# Patient Record
Sex: Male | Born: 1950 | Race: White | Hispanic: No | Marital: Married | Smoking: Former smoker
Health system: Southern US, Community
[De-identification: ages and names within clinical notes are randomized; demographics above are authoritative.]

## PROBLEM LIST (undated history)

## (undated) DIAGNOSIS — Z87442 Personal history of urinary calculi: Secondary | ICD-10-CM

## (undated) DIAGNOSIS — E785 Hyperlipidemia, unspecified: Secondary | ICD-10-CM

## (undated) DIAGNOSIS — M1990 Unspecified osteoarthritis, unspecified site: Secondary | ICD-10-CM

## (undated) DIAGNOSIS — I10 Essential (primary) hypertension: Secondary | ICD-10-CM

## (undated) DIAGNOSIS — R55 Syncope and collapse: Secondary | ICD-10-CM

## (undated) DIAGNOSIS — I2510 Atherosclerotic heart disease of native coronary artery without angina pectoris: Secondary | ICD-10-CM

## (undated) DIAGNOSIS — R001 Bradycardia, unspecified: Secondary | ICD-10-CM

## (undated) HISTORY — PX: SKIN CANCER EXCISION: SHX779

## (undated) HISTORY — PX: TOTAL HIP ARTHROPLASTY: SHX124

## (undated) HISTORY — DX: Bradycardia, unspecified: R00.1

## (undated) HISTORY — DX: Syncope and collapse: R55

## (undated) HISTORY — DX: Essential (primary) hypertension: I10

## (undated) HISTORY — DX: Hyperlipidemia, unspecified: E78.5

## (undated) HISTORY — PX: ROTATOR CUFF REPAIR: SHX139

---

## 2005-03-14 ENCOUNTER — Inpatient Hospital Stay (HOSPITAL_COMMUNITY): Admission: RE | Admit: 2005-03-14 | Discharge: 2005-03-18 | Payer: Self-pay | Admitting: Orthopedic Surgery

## 2005-07-10 DIAGNOSIS — C4490 Unspecified malignant neoplasm of skin, unspecified: Secondary | ICD-10-CM | POA: Insufficient documentation

## 2005-07-10 HISTORY — DX: Unspecified malignant neoplasm of skin, unspecified: C44.90

## 2006-01-15 ENCOUNTER — Ambulatory Visit: Payer: Self-pay | Admitting: Oncology

## 2006-04-09 ENCOUNTER — Ambulatory Visit: Payer: Self-pay | Admitting: Oncology

## 2006-07-12 IMAGING — CR DG CHEST 2V
2 series · 2 of 2 positions shown · non-contrast
Comparison: none

CLINICAL DATA: Osteoarthritis of right hip.  Some chest congestion.  Nonsmoker.  Hypertension.  Patient for right total hip replacement.
 CHEST - 2 VIEW:

[view not recorded (1 of 2)]
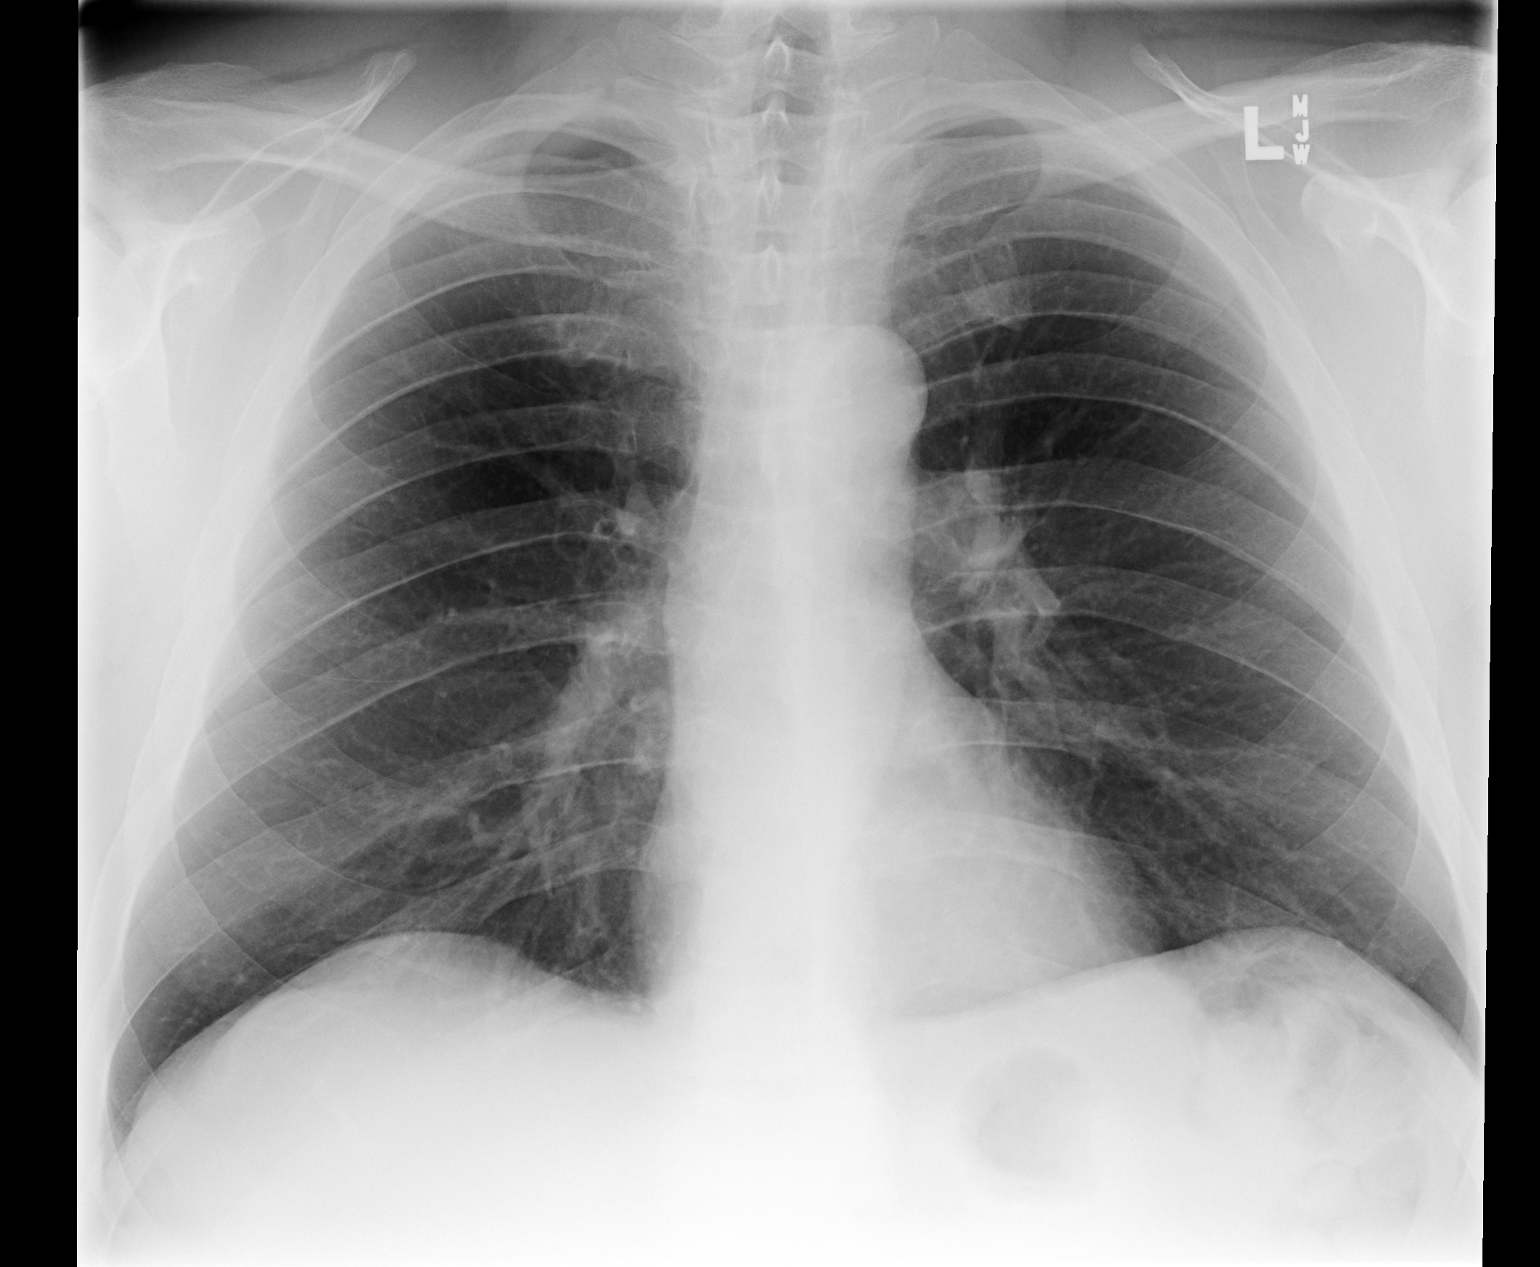

[view not recorded (2 of 2)]
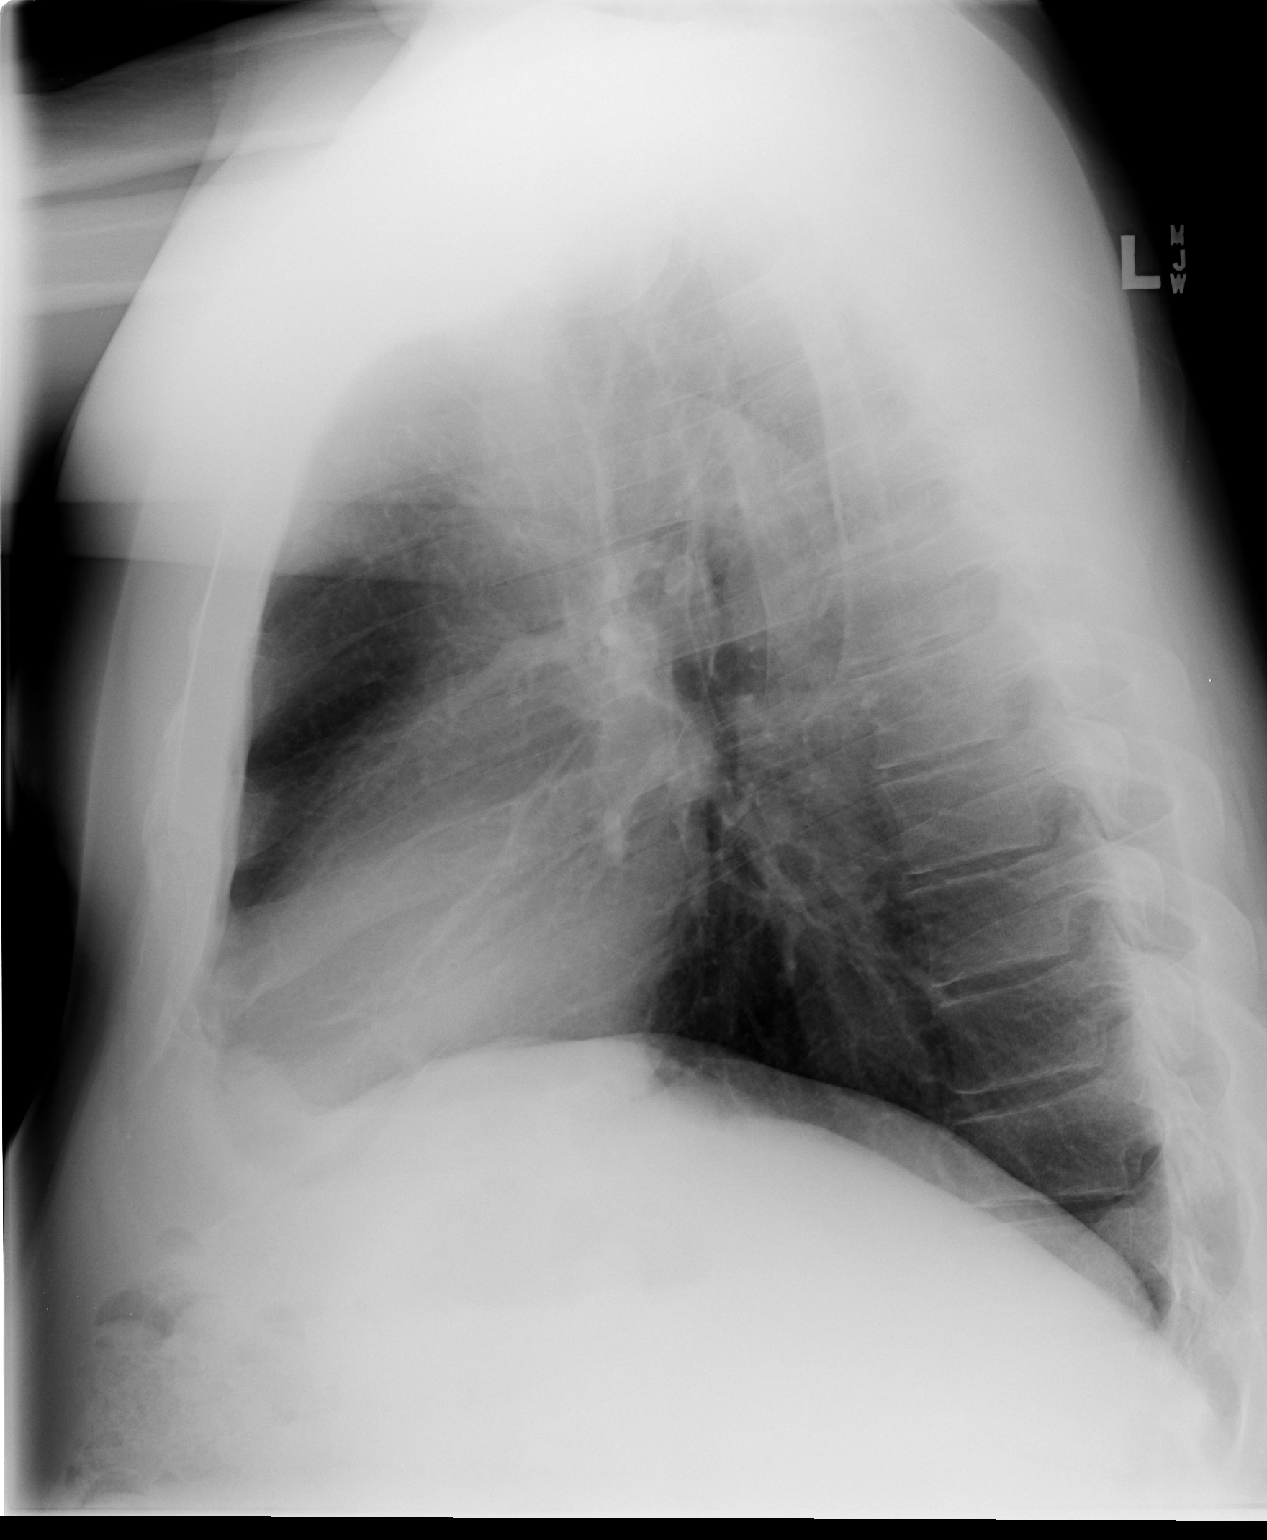

[2 of 2 positions shown; findings below may reference images not displayed]

FINDINGS: PA and lateral views of the chest without previous films for comparison show the aorta to be minimally elongated but not calcified or dilated.  The heart is normal and the lungs are clear.  The bony thorax appears normal.
IMPRESSION: No evidence of active disease in the chest.

## 2006-07-30 ENCOUNTER — Ambulatory Visit: Payer: Self-pay | Admitting: Oncology

## 2006-10-22 ENCOUNTER — Ambulatory Visit: Payer: Self-pay | Admitting: Oncology

## 2007-02-11 ENCOUNTER — Ambulatory Visit: Payer: Self-pay | Admitting: Oncology

## 2010-11-25 NOTE — H&P (Signed)
Steven Kennedy, Steven Kennedy                ACCOUNT NO.:  [id number]   MEDICAL RECORD NO.:  [id number]          PATIENT TYPE:  INP   LOCATION:                               FACILITY:  [facility]   PHYSICIAN:  Rodney A. Mortenson, M.D.DATE OF BIRTH:  [birth year]   DATE OF ADMISSION:  03/14/2005  DATE OF DISCHARGE:                                HISTORY & PHYSICAL   CHIEF COMPLAINT:  Right hip pain.   HISTORY OF PRESENT ILLNESS:  Patient is a 60 year old white male with  about one-year history of progressively worsening right hip pain and  stiffness.  Patient states initially he started with some discomfort and  stiffness in the hip with awkward movements.  It has progressively worsened.  He now has significant amount of stiffness and deep, dull achiness in the  groin.  He does have mechanical symptoms.  He has minimal night pain.  Pain  worsens with the amount of activity and the amount of time standing on his  feet.  X-rays reveal bone on bone, femoral head in the acetabulum with a  cystic acetabulum.   ALLERGIES:  No known drug allergies.   CURRENT MEDICATIONS:  1.  Flaxseed oil 1000 mg p.o. b.i.d.  2.  Fish oil 1000 mg p.o. b.i.d.  3.  Cod liver oil with vitamin A and D one tablet p.o. b.i.d.  4.  Garlic 500 mg p.o. b.i.d.  5.  Multivitamins with mineral one tablet p.o. daily.   PAST MEDICAL HISTORY:  Patient denies any significant medical history  including diabetes, hypertension, heart disease, asthma.   PAST SURGICAL HISTORY:  1.  Right shoulder rotator cuff arthroscopy.  2.  Vasectomy.  3.  Patient denies any complications of the above-mentioned anesthetic      procedures.   SOCIAL HISTORY:  Patient is a 60 year old healthy-appearing white male.  Denies any history of smoking or alcohol use.  Is married.  Has two grown  children.  Lives in a [place] house.  Is currently employed and working as  a Merchandiser, retail with [employer].   Family physician is Dr. Beather Arbour in  [place].   FAMILY HISTORY:  Mother is alive at 49 years of age with a history of a  CABG.  Father is deceased at 9 years of age from heart failure.  One  brother is deceased from a suicide.  Two sisters alive in good medical  health.   REVIEW OF SYSTEMS:  Positive for pollen-related allergies.  He does wear  glasses occasionally for near sightedness and reading.  Otherwise, all  review of systems are negative in general, sensory, respiratory, cardiac,  GI, GU, hematological, musculoskeletal issues if not mentioned above.   PHYSICAL EXAMINATION:  VITAL SIGNS:  Height is 6 feet 1 inch, weight is 225  pounds, blood pressure is 144/88, pulse is 60 and regular, respirations 12.  Patient is afebrile.  GENERAL:  This is a healthy-appearing, well-developed white male.  Ambulates  with an antalgic gait on the right.  Easily gets on and off the examination  table.  HEENT:  Head was normocephalic.  Pupils are equal, round, and reactive.  Extraocular movements intact.  Sclerae not icteric.  Conjunctivae pink and  moist.  The external ears are without deformities.  Oral buccal mucosa is  pink and moist.  Gross hearing is intact.  NECK:  Supple.  No palpable lymphadenopathy.  Thyroid region was nontender.  Patient had good range of motion of cervical spine without any difficulty or  tenderness.  EXTREMITIES:  Upper extremities were symmetrically size and shape.  He had  full range of motion of shoulders, elbows, and wrists.  Motor strength was  5/5.  He did have some deformities and loss of range of motion of his  fingers related to previous injuries.  Lower extremities:  Right hip had  full extension, flexion up to 90 degrees with about 5 degrees of internal  rotation and 10 degrees external rotation limited by mechanical and  discomfort in the right groin.  Left hip had full extension/flexion up to  130 degrees with 20 degrees internal/external rotation without any  mechanical symptoms of  discomfort.  Bilateral knees were symmetrical with no  effusions, no erythema.  Without full extension, flexion back to 120  degrees.  No instability.  The calves were nontender.  The ankles were  symmetrical with good dorsi/plantar flexion.  HEART:  Regular rate and rhythm.  No murmurs, rubs, or gallops.  LUNGS:  Clear and equal bilaterally.  No wheezes, rales, rhonchi.  PERIPHERAL VASCULAR:  Carotid pulses were 2+.  No bruits.  Radial pulses  were 2+.  Dorsalis pedis was 1+.  He had no lower extremity edema or venous  stasis changes.  He did have a few varicosities in the lower extremities.  NEUROLOGIC:  Patient was conscious, alert, and appropriate.  Ease in  conversation with examiner.  Cranial nerves II-XII were grossly intact.  He  had no gross neurologic defects noted.  BREASTS:  Deferred at this time.  RECTAL:  Deferred at this time.  GENITOURINARY:  Deferred at this time.   IMPRESSION:  End-stage osteoarthritis right hip.   PLAN:  Patient will undergo all routine laboratories and tests prior to  having a right total hip arthroplasty by Dr. Chaney Malling at [facility] on March 14, 2005.      Jamelle Rushing, P.A.    ______________________________  Lenard Galloway Chaney Malling, M.D.    RWK/MEDQ  D:  03/06/2005  T:  03/06/2005  Job:  [id number]

## 2010-11-25 NOTE — Op Note (Signed)
Steven Kennedy, KISTLER NO.:  [id number]   MEDICAL RECORD NO.:  [id number]          PATIENT TYPE:  INP   LOCATION:  2899                         FACILITY:  [facility]   PHYSICIAN:  Lenard Galloway. Mortenson, M.D.DATE OF BIRTH:  06-18-1951   DATE OF PROCEDURE:  03/14/2005  DATE OF DISCHARGE:                                 OPERATIVE REPORT   PREOPERATIVE DIAGNOSIS:  Severe osteoarthritis, right hip.   POSTOPERATIVE DIAGNOSIS:  Severe osteoarthritis, right hip.   OPERATION PERFORMED:  Total hip replacement on the right using a AML small  stature 13.5 mm diameter with a 100 series Pinnacle acetabular cup outside  diameter 58 mm, Apex hole eliminator and a +4 10 degree liner with a 36 mm  inside diameter and a +5 36 mm metal femoral head.   SURGEON:  Lenard Galloway. Chaney Malling, M.D.   ASSISTANT:  1.  Claude Manges. Cleophas Dunker, M.D.  2.  Legrand Pitts. Duffy, P.A.   ANESTHESIA:  General.   DESCRIPTION OF PROCEDURE:  Patient placed on the operating table in supine  position.  After satisfactory general anesthesia, the patient was placed in  full lateral position with the right hip up.  The right hip was prepped with  DuraPrep and draped out in the usual manner.  A Vi-drape was placed over the  operative site.  A posterior Southern incision was made.  Skin edges were  retracted.  Self-retaining retractor was put in the wound.  Bleeders were  coagulated.  Tensor fascia lata was then split. The external hip rotators  were then identified and isolated.  The piriformis was tagged.  The sciatic  nerve was palpated with the finger and this is out of the operative field.  The piriformis was then released and reflected to the midline.  This gave  excellent exposure to the capsule with the hip internally rotated.  Incision  made through the capsule from the superior neck to the inferior neck.  Hip  was then dislocated.  With the head and neck exposed, the neck was amputated  appropriate length using the  external guide and saw.  This was then removed.  Excellent access to the acetabulum was achieved.  Bleeders were coagulated.  Attention was then turned to the proximal femur. A needle was placed  underneath the femoral neck and a hole starter was placed in the area of the  piriformis fossa.  Once this was accomplished, the canal finder was passed  down the femoral canal. A series of fully fluted reamers was then used and  this was reamed up to 13 mm in diameter.  There was excellent capture at  this level.  A series of broaches was then used and this broached out to a  13.5 mm small stature AML.  This seated very nicely in the calcar.  Calcar  reamer was then used.  The broach fit very tightly in the canal.  The broach  was then removed.  Attention was then turned to the acetabulum.  Sharp Cobra  retractor was placed around the rim.  Labrum was excised.  A series of  cheese cut reamers then used ream out the acetabulum.  This was reamed out  to a 57 mm outside diameter and good bleeding bone was achieved.  A 56 mm  trial was inserted and this bottomed out nicely.  A 58 mm trial was inserted  and this hung up on the rim very nicely.  This was felt to be the  appropriate size.  The final 58 mm acetabular Pinnacle cup was then driven  down into the acetabulum.  There was an excellent press fit.  This was an  anatomic fit.  A trial liner was then inserted.  The broach was passed down  the femoral canal again and a series of neck lengths were tried.  The +5  neck length with a 36 mm femoral ball was inserted and there was excellent  range of motion.  Leg lengths were essentially equal.  There was good  stability with internal and external rotation in traction.  The hip was  dislocated.  All the trial components were then removed.  Throughout the  procedure, the hip was irrigated with copious amounts of saline solution.  Retractor used to protect the sciatic nerve throughout the procedure.  The   final poly was then inserted, snap fit in place after the apex hole  eliminator had been inserted.  The final femoral AML prosthesis was then  driven down the femoral canal and had excellent press fit.  The trial ball  was inserted once again and the hip was relocated and put through a full  range of motion and it was felt this was appropriate length ball fit. Hip  was dislocated and a final +5 36 mm outer diameter ball was then placed over  the trunnion and then impacted.  An excellent fit was then achieved. The hip  was then relocated, and put through a full range of motion.  The capsule was  then closed almost anatomically using heavy FiberWire sutures.  Piriformis  was then reattached.  Tensor fascia lata was then closed with interrupted  Ethibond suture.  Vicryl was used to close the subcutaneous tissue and  stainless steel staples used to close the skin.  Sterile dressings were  applied and the patient returned to the recovery room in excellent  condition. Technically, this procedure went extremely well.   DRAINS:  None.   COMPLICATIONS:  None.   I was very pleased with the surgical outcome.           ______________________________  Lenard Galloway. Chaney Malling, M.D.     RAM/MEDQ  D:  03/14/2005  T:  03/14/2005  Job:  [id number]

## 2010-11-25 NOTE — Discharge Summary (Signed)
Steven Kennedy, Steven Kennedy                ACCOUNT NO.:  [id number]   MEDICAL RECORD NO.:  [id number]          PATIENT TYPE:  INP   LOCATION:  5006                         FACILITY:  [facility]   PHYSICIAN:  Rodney A. Mortenson, M.D.DATE OF BIRTH:  01-14-1951   DATE OF ADMISSION:  03/14/2005  DATE OF DISCHARGE:  03/18/2005                                 DISCHARGE SUMMARY   ADMISSION DIAGNOSES:  End-stage osteoarthritis right hip.   DISCHARGE DIAGNOSES:  1.  End-stage osteoarthritis right hip.  2.  Acute blood loss anemia secondary to surgery.  3.  Nausea second to pain medications, now resolved.  4.  Constipation.  5.  Hiccups x3 days with irregular EKG with negative work-up.   SURGICAL PROCEDURE:  On March 14, 2005 Steven Kennedy underwent a right total  hip arthroplasty by Dr. Rinaldo Ratel, assisted by Dr. Claude Manges. Steven Kennedy  and [employer], PA-C.  He had a Pinnacle 100 series acetabular cup size 58  mm with Pinnacle Marathon acetabular liner +4, 10-degree  58 mm outer  diameter, 36 mm inner diameter, and an apex hole eliminator.  An AML small  stature 155 mm length, 43 mm offset size, 13.5 femoral stem with an Articul-  eze metal on metal femoral head 36 mm +5 neck length, 12/14 cone.   COMPLICATIONS:  None.   CONSULTANT:  1.  Physical therapy and occupational therapy consult September 6 and      March 16, 2005  2.  Cardiology consult by Dr. Sharyn Lull March 17, 2005   HISTORY OF PRESENT ILLNESS:  This 60 year old white male patient presented  to Dr. Chaney Malling with a one year history of progressively worsening right  hip pain and stiffness.  Is now significant stiffness and deep dull achiness  in the groin.  He complains of minimal night pain.  Pain increases with  activity and standing on his feet.  X-rays show end-stage arthritic changes.  He has failed conservative treatment and because of this he is presenting  for a right hip replacement.   HOSPITAL COURSE:  Steven Kennedy  tolerated his surgical procedure well without  immediate postoperative complications.  He was transferred to 5000.  On  postoperative day one Tmax 98, vitals stable.  Leg was neurovascular intact.  He was complaining of some nausea with medications and those were adjusted.  He was started on therapy per protocol.   Postoperative day two Tmax 101.1, hemoglobin 11.6, hematocrit 32.5.  Right  hip incision was benign.  He was switched to p.o. pain medications,  continued on therapy.   On postoperative day three he complained of continued hiccups for the last  several days.  Vitals were stable.  Hemoglobin had stabilized at 10.5,  hematocrit 29.4.  Wound was benign.  Because of his continued hiccups an EKG  and cardiac enzymes were obtained.  EKG did show possible inferior infarct  age undetermined.  A cardiology consult by Dr. Sharyn Lull was obtained.  He  evaluated and his cardiac enzymes were negative.  It was felt this was not  cardiac in origin and it was felt he  was okay for discharge home at that  time.  He did recommend a cardiac work-up for a Persantine test and 2-D  echocardiogram as an outpatient after discharge.  Patient was discharged  home later that day.   DISCHARGE INSTRUCTIONS:   DIET:  He can resume his regular pre hospitalization diet.   MEDICATIONS:  He may resume his preoperative medications.  These include:  1.  Flaxseed oil 1000 mg p.o. b.i.d..  2.  Fish oil 1000 mg p.o. b.i.d..  3.  Cod liver oil with vitamin A and D one tablet p.o. b.i.d..  4.  Garlic 500 mg p.o. b.i.d..  5.  Multivitamin with minerals one tablet p.o. q.a.m.   Additional medications at this time include:  1.  Arixtra 2.5 mg subcutaneous every 8 p.m., last dose March 22, 2005.  2.  Percocet 5/325 mg one to two tablets p.o. q.4h. p.r.n. for pain, 50 with      no refill.  3.  Robaxin 500 mg one to two tablets p.o. q.6h. p.r.n. for spasms, 40 with      no refill.   ACTIVITY:  He can be out of  bed partial weightbearing 50% or less on the  right leg with the use of the walker.  He is to have home health PT per  [facility].   WOUND CARE:  He may shower after no drainage from the wound for two days.  Please see the blue total hip discharge sheet for further activity and wound  care instructions.   FOLLOW-UP:  He needs to follow up with Dr. Chaney Malling in our office in  approximately 10 days and needs to call [phone] for that appointment.   LABORATORY DATA:  X-ray taken of his right hip on March 14, 2005 showed  no complications status post right hip replacement.   Hemoglobin/hematocrit ranged from 15.3 and 44 on September 5 to a low of  10.5 and 29.4 on September 8.  Platelets and white count were within normal  limits.   Glucose ranged from 90 on September 5 to a high of 111 on September 6.  Calcium ranged from 9.2 on the 5th to a low of 8.3 on the 6th and 7th.  All  other laboratory studies were within normal limits.      Legrand Pitts Duffy, P.A.    ______________________________  Lenard Galloway Chaney Malling, M.D.    KED/MEDQ  D:  05/24/2005  T:  05/24/2005  Job:  [id number]   cc:   Eduardo Osier. Sharyn Lull, M.D.  Fax: [phone]   Beather Arbour, M.D.

## 2015-09-01 DIAGNOSIS — J0100 Acute maxillary sinusitis, unspecified: Secondary | ICD-10-CM | POA: Diagnosis not present

## 2016-04-24 DIAGNOSIS — Z6829 Body mass index (BMI) 29.0-29.9, adult: Secondary | ICD-10-CM | POA: Diagnosis not present

## 2016-04-24 DIAGNOSIS — M545 Low back pain: Secondary | ICD-10-CM | POA: Diagnosis not present

## 2016-05-04 DIAGNOSIS — L570 Actinic keratosis: Secondary | ICD-10-CM | POA: Diagnosis not present

## 2016-05-04 DIAGNOSIS — C44519 Basal cell carcinoma of skin of other part of trunk: Secondary | ICD-10-CM | POA: Diagnosis not present

## 2016-05-08 DIAGNOSIS — M545 Low back pain: Secondary | ICD-10-CM | POA: Diagnosis not present

## 2016-05-18 DIAGNOSIS — C44519 Basal cell carcinoma of skin of other part of trunk: Secondary | ICD-10-CM | POA: Diagnosis not present

## 2016-06-02 DIAGNOSIS — J0100 Acute maxillary sinusitis, unspecified: Secondary | ICD-10-CM | POA: Diagnosis not present

## 2016-09-05 DIAGNOSIS — Z85820 Personal history of malignant melanoma of skin: Secondary | ICD-10-CM | POA: Diagnosis not present

## 2016-09-05 DIAGNOSIS — L570 Actinic keratosis: Secondary | ICD-10-CM | POA: Diagnosis not present

## 2016-09-05 DIAGNOSIS — R233 Spontaneous ecchymoses: Secondary | ICD-10-CM | POA: Diagnosis not present

## 2016-12-19 DIAGNOSIS — M545 Low back pain: Secondary | ICD-10-CM | POA: Diagnosis not present

## 2016-12-19 DIAGNOSIS — M9902 Segmental and somatic dysfunction of thoracic region: Secondary | ICD-10-CM | POA: Diagnosis not present

## 2016-12-19 DIAGNOSIS — M9903 Segmental and somatic dysfunction of lumbar region: Secondary | ICD-10-CM | POA: Diagnosis not present

## 2016-12-19 DIAGNOSIS — M9901 Segmental and somatic dysfunction of cervical region: Secondary | ICD-10-CM | POA: Diagnosis not present

## 2016-12-19 DIAGNOSIS — M62830 Muscle spasm of back: Secondary | ICD-10-CM | POA: Diagnosis not present

## 2016-12-19 DIAGNOSIS — M542 Cervicalgia: Secondary | ICD-10-CM | POA: Diagnosis not present

## 2016-12-21 DIAGNOSIS — M9901 Segmental and somatic dysfunction of cervical region: Secondary | ICD-10-CM | POA: Diagnosis not present

## 2016-12-21 DIAGNOSIS — M542 Cervicalgia: Secondary | ICD-10-CM | POA: Diagnosis not present

## 2016-12-21 DIAGNOSIS — M9903 Segmental and somatic dysfunction of lumbar region: Secondary | ICD-10-CM | POA: Diagnosis not present

## 2016-12-21 DIAGNOSIS — M9902 Segmental and somatic dysfunction of thoracic region: Secondary | ICD-10-CM | POA: Diagnosis not present

## 2016-12-21 DIAGNOSIS — M62830 Muscle spasm of back: Secondary | ICD-10-CM | POA: Diagnosis not present

## 2016-12-21 DIAGNOSIS — M545 Low back pain: Secondary | ICD-10-CM | POA: Diagnosis not present

## 2016-12-26 DIAGNOSIS — M9901 Segmental and somatic dysfunction of cervical region: Secondary | ICD-10-CM | POA: Diagnosis not present

## 2016-12-26 DIAGNOSIS — M9903 Segmental and somatic dysfunction of lumbar region: Secondary | ICD-10-CM | POA: Diagnosis not present

## 2016-12-26 DIAGNOSIS — M62830 Muscle spasm of back: Secondary | ICD-10-CM | POA: Diagnosis not present

## 2016-12-26 DIAGNOSIS — M542 Cervicalgia: Secondary | ICD-10-CM | POA: Diagnosis not present

## 2016-12-26 DIAGNOSIS — M545 Low back pain: Secondary | ICD-10-CM | POA: Diagnosis not present

## 2016-12-26 DIAGNOSIS — M9902 Segmental and somatic dysfunction of thoracic region: Secondary | ICD-10-CM | POA: Diagnosis not present

## 2017-01-02 DIAGNOSIS — M545 Low back pain: Secondary | ICD-10-CM | POA: Diagnosis not present

## 2017-01-02 DIAGNOSIS — M9902 Segmental and somatic dysfunction of thoracic region: Secondary | ICD-10-CM | POA: Diagnosis not present

## 2017-01-02 DIAGNOSIS — M542 Cervicalgia: Secondary | ICD-10-CM | POA: Diagnosis not present

## 2017-01-02 DIAGNOSIS — M9903 Segmental and somatic dysfunction of lumbar region: Secondary | ICD-10-CM | POA: Diagnosis not present

## 2017-01-02 DIAGNOSIS — M62830 Muscle spasm of back: Secondary | ICD-10-CM | POA: Diagnosis not present

## 2017-01-02 DIAGNOSIS — M9901 Segmental and somatic dysfunction of cervical region: Secondary | ICD-10-CM | POA: Diagnosis not present

## 2017-02-28 DIAGNOSIS — Z125 Encounter for screening for malignant neoplasm of prostate: Secondary | ICD-10-CM | POA: Diagnosis not present

## 2017-02-28 DIAGNOSIS — Z1389 Encounter for screening for other disorder: Secondary | ICD-10-CM | POA: Diagnosis not present

## 2017-02-28 DIAGNOSIS — Z139 Encounter for screening, unspecified: Secondary | ICD-10-CM | POA: Diagnosis not present

## 2017-02-28 DIAGNOSIS — Z1211 Encounter for screening for malignant neoplasm of colon: Secondary | ICD-10-CM | POA: Diagnosis not present

## 2017-02-28 DIAGNOSIS — Z0000 Encounter for general adult medical examination without abnormal findings: Secondary | ICD-10-CM | POA: Diagnosis not present

## 2017-02-28 DIAGNOSIS — Z13220 Encounter for screening for lipoid disorders: Secondary | ICD-10-CM | POA: Diagnosis not present

## 2017-02-28 DIAGNOSIS — Z131 Encounter for screening for diabetes mellitus: Secondary | ICD-10-CM | POA: Diagnosis not present

## 2017-02-28 DIAGNOSIS — E663 Overweight: Secondary | ICD-10-CM | POA: Diagnosis not present

## 2017-04-17 DIAGNOSIS — Z1211 Encounter for screening for malignant neoplasm of colon: Secondary | ICD-10-CM | POA: Diagnosis not present

## 2017-05-02 DIAGNOSIS — Z79899 Other long term (current) drug therapy: Secondary | ICD-10-CM | POA: Diagnosis not present

## 2017-05-02 DIAGNOSIS — K648 Other hemorrhoids: Secondary | ICD-10-CM | POA: Diagnosis not present

## 2017-05-02 DIAGNOSIS — Q438 Other specified congenital malformations of intestine: Secondary | ICD-10-CM | POA: Diagnosis not present

## 2017-05-02 DIAGNOSIS — M545 Low back pain: Secondary | ICD-10-CM | POA: Diagnosis not present

## 2017-05-02 DIAGNOSIS — I809 Phlebitis and thrombophlebitis of unspecified site: Secondary | ICD-10-CM | POA: Diagnosis not present

## 2017-05-02 DIAGNOSIS — K649 Unspecified hemorrhoids: Secondary | ICD-10-CM | POA: Diagnosis not present

## 2017-05-02 DIAGNOSIS — M129 Arthropathy, unspecified: Secondary | ICD-10-CM | POA: Diagnosis not present

## 2017-05-02 DIAGNOSIS — K5730 Diverticulosis of large intestine without perforation or abscess without bleeding: Secondary | ICD-10-CM | POA: Diagnosis not present

## 2017-05-02 DIAGNOSIS — Z1211 Encounter for screening for malignant neoplasm of colon: Secondary | ICD-10-CM | POA: Diagnosis not present

## 2017-05-17 DIAGNOSIS — C44719 Basal cell carcinoma of skin of left lower limb, including hip: Secondary | ICD-10-CM | POA: Diagnosis not present

## 2017-05-17 DIAGNOSIS — Z85820 Personal history of malignant melanoma of skin: Secondary | ICD-10-CM | POA: Diagnosis not present

## 2017-05-17 DIAGNOSIS — L570 Actinic keratosis: Secondary | ICD-10-CM | POA: Diagnosis not present

## 2017-05-17 DIAGNOSIS — L578 Other skin changes due to chronic exposure to nonionizing radiation: Secondary | ICD-10-CM | POA: Diagnosis not present

## 2017-05-17 DIAGNOSIS — C44712 Basal cell carcinoma of skin of right lower limb, including hip: Secondary | ICD-10-CM | POA: Diagnosis not present

## 2017-05-17 DIAGNOSIS — L821 Other seborrheic keratosis: Secondary | ICD-10-CM | POA: Diagnosis not present

## 2017-06-14 DIAGNOSIS — M25551 Pain in right hip: Secondary | ICD-10-CM | POA: Diagnosis not present

## 2017-06-14 DIAGNOSIS — Z6831 Body mass index (BMI) 31.0-31.9, adult: Secondary | ICD-10-CM | POA: Diagnosis not present

## 2017-06-20 DIAGNOSIS — Z6832 Body mass index (BMI) 32.0-32.9, adult: Secondary | ICD-10-CM | POA: Diagnosis not present

## 2017-06-20 DIAGNOSIS — M25551 Pain in right hip: Secondary | ICD-10-CM | POA: Diagnosis not present

## 2017-06-21 DIAGNOSIS — C44719 Basal cell carcinoma of skin of left lower limb, including hip: Secondary | ICD-10-CM | POA: Diagnosis not present

## 2017-09-25 DIAGNOSIS — L821 Other seborrheic keratosis: Secondary | ICD-10-CM | POA: Diagnosis not present

## 2017-09-25 DIAGNOSIS — L570 Actinic keratosis: Secondary | ICD-10-CM | POA: Diagnosis not present

## 2017-09-25 DIAGNOSIS — Z85820 Personal history of malignant melanoma of skin: Secondary | ICD-10-CM | POA: Diagnosis not present

## 2018-04-02 DIAGNOSIS — Z85820 Personal history of malignant melanoma of skin: Secondary | ICD-10-CM | POA: Diagnosis not present

## 2018-04-02 DIAGNOSIS — L821 Other seborrheic keratosis: Secondary | ICD-10-CM | POA: Diagnosis not present

## 2018-04-02 DIAGNOSIS — L570 Actinic keratosis: Secondary | ICD-10-CM | POA: Diagnosis not present

## 2018-06-10 DIAGNOSIS — S8991XA Unspecified injury of right lower leg, initial encounter: Secondary | ICD-10-CM | POA: Diagnosis not present

## 2018-06-10 DIAGNOSIS — M25561 Pain in right knee: Secondary | ICD-10-CM | POA: Diagnosis not present

## 2018-06-10 DIAGNOSIS — Z0000 Encounter for general adult medical examination without abnormal findings: Secondary | ICD-10-CM | POA: Diagnosis not present

## 2018-06-10 DIAGNOSIS — Z139 Encounter for screening, unspecified: Secondary | ICD-10-CM | POA: Diagnosis not present

## 2018-06-10 DIAGNOSIS — Z1331 Encounter for screening for depression: Secondary | ICD-10-CM | POA: Diagnosis not present

## 2018-06-10 DIAGNOSIS — E669 Obesity, unspecified: Secondary | ICD-10-CM | POA: Diagnosis not present

## 2018-06-18 DIAGNOSIS — J0110 Acute frontal sinusitis, unspecified: Secondary | ICD-10-CM | POA: Diagnosis not present

## 2018-06-18 DIAGNOSIS — Z6831 Body mass index (BMI) 31.0-31.9, adult: Secondary | ICD-10-CM | POA: Diagnosis not present

## 2018-06-18 DIAGNOSIS — M25569 Pain in unspecified knee: Secondary | ICD-10-CM | POA: Diagnosis not present

## 2018-12-24 DIAGNOSIS — M545 Low back pain: Secondary | ICD-10-CM | POA: Diagnosis not present

## 2018-12-24 DIAGNOSIS — Z7189 Other specified counseling: Secondary | ICD-10-CM | POA: Diagnosis not present

## 2018-12-24 DIAGNOSIS — R030 Elevated blood-pressure reading, without diagnosis of hypertension: Secondary | ICD-10-CM | POA: Diagnosis not present

## 2018-12-24 DIAGNOSIS — Z6831 Body mass index (BMI) 31.0-31.9, adult: Secondary | ICD-10-CM | POA: Diagnosis not present

## 2019-01-23 DIAGNOSIS — I10 Essential (primary) hypertension: Secondary | ICD-10-CM | POA: Diagnosis not present

## 2019-01-23 DIAGNOSIS — Z6830 Body mass index (BMI) 30.0-30.9, adult: Secondary | ICD-10-CM | POA: Diagnosis not present

## 2019-01-30 DIAGNOSIS — I10 Essential (primary) hypertension: Secondary | ICD-10-CM | POA: Diagnosis not present

## 2019-01-30 DIAGNOSIS — Z125 Encounter for screening for malignant neoplasm of prostate: Secondary | ICD-10-CM | POA: Diagnosis not present

## 2019-01-30 DIAGNOSIS — Z1329 Encounter for screening for other suspected endocrine disorder: Secondary | ICD-10-CM | POA: Diagnosis not present

## 2019-01-30 DIAGNOSIS — Z13220 Encounter for screening for lipoid disorders: Secondary | ICD-10-CM | POA: Diagnosis not present

## 2019-01-30 DIAGNOSIS — Z131 Encounter for screening for diabetes mellitus: Secondary | ICD-10-CM | POA: Diagnosis not present

## 2019-02-06 DIAGNOSIS — R252 Cramp and spasm: Secondary | ICD-10-CM | POA: Diagnosis not present

## 2019-02-06 DIAGNOSIS — Z6831 Body mass index (BMI) 31.0-31.9, adult: Secondary | ICD-10-CM | POA: Diagnosis not present

## 2019-02-06 DIAGNOSIS — E785 Hyperlipidemia, unspecified: Secondary | ICD-10-CM | POA: Diagnosis not present

## 2019-02-06 DIAGNOSIS — I10 Essential (primary) hypertension: Secondary | ICD-10-CM | POA: Diagnosis not present

## 2019-03-13 DIAGNOSIS — Z6831 Body mass index (BMI) 31.0-31.9, adult: Secondary | ICD-10-CM | POA: Diagnosis not present

## 2019-03-13 DIAGNOSIS — R55 Syncope and collapse: Secondary | ICD-10-CM | POA: Diagnosis not present

## 2019-03-14 DIAGNOSIS — R55 Syncope and collapse: Secondary | ICD-10-CM | POA: Diagnosis not present

## 2019-03-20 DIAGNOSIS — R001 Bradycardia, unspecified: Secondary | ICD-10-CM | POA: Diagnosis not present

## 2019-03-20 DIAGNOSIS — R55 Syncope and collapse: Secondary | ICD-10-CM | POA: Diagnosis not present

## 2019-03-20 DIAGNOSIS — Z6830 Body mass index (BMI) 30.0-30.9, adult: Secondary | ICD-10-CM | POA: Diagnosis not present

## 2019-03-25 DIAGNOSIS — R000 Tachycardia, unspecified: Secondary | ICD-10-CM | POA: Diagnosis not present

## 2019-04-02 DIAGNOSIS — R55 Syncope and collapse: Secondary | ICD-10-CM | POA: Diagnosis not present

## 2019-04-02 DIAGNOSIS — R001 Bradycardia, unspecified: Secondary | ICD-10-CM | POA: Diagnosis not present

## 2019-04-03 DIAGNOSIS — R001 Bradycardia, unspecified: Secondary | ICD-10-CM | POA: Diagnosis not present

## 2019-04-03 DIAGNOSIS — R55 Syncope and collapse: Secondary | ICD-10-CM | POA: Diagnosis not present

## 2019-04-03 DIAGNOSIS — Z6830 Body mass index (BMI) 30.0-30.9, adult: Secondary | ICD-10-CM | POA: Diagnosis not present

## 2019-04-08 DIAGNOSIS — D485 Neoplasm of uncertain behavior of skin: Secondary | ICD-10-CM | POA: Diagnosis not present

## 2019-04-08 DIAGNOSIS — L905 Scar conditions and fibrosis of skin: Secondary | ICD-10-CM | POA: Diagnosis not present

## 2019-04-08 DIAGNOSIS — L570 Actinic keratosis: Secondary | ICD-10-CM | POA: Diagnosis not present

## 2019-04-08 DIAGNOSIS — D225 Melanocytic nevi of trunk: Secondary | ICD-10-CM | POA: Diagnosis not present

## 2019-04-15 DIAGNOSIS — D485 Neoplasm of uncertain behavior of skin: Secondary | ICD-10-CM | POA: Diagnosis not present

## 2019-05-23 ENCOUNTER — Other Ambulatory Visit: Payer: Self-pay

## 2019-05-23 ENCOUNTER — Ambulatory Visit (INDEPENDENT_AMBULATORY_CARE_PROVIDER_SITE_OTHER): Payer: PPO | Admitting: Cardiology

## 2019-05-23 ENCOUNTER — Encounter: Payer: Self-pay | Admitting: Cardiology

## 2019-05-23 ENCOUNTER — Encounter: Payer: Self-pay | Admitting: *Deleted

## 2019-05-23 DIAGNOSIS — R55 Syncope and collapse: Secondary | ICD-10-CM | POA: Diagnosis not present

## 2019-05-23 DIAGNOSIS — R079 Chest pain, unspecified: Secondary | ICD-10-CM | POA: Diagnosis not present

## 2019-05-23 DIAGNOSIS — I10 Essential (primary) hypertension: Secondary | ICD-10-CM

## 2019-05-23 NOTE — Progress Notes (Signed)
Cardiology Office Note:    Date:  05/23/2019   ID:  Steven Kennedy, DOB 09-13-1950, MRN [id number]  PCP:  Maryella Shivers, MD  Cardiologist:  Shirlee More, MD   Referring MD: Maryella Shivers, MD  ASSESSMENT:    1. Syncope and collapse   2. Essential hypertension    PLAN:    In order of problems listed above:  1. His episode was quite profound and is atypical for both fainting and seizure.  There are individuals who have prolonged asystole usually greater than 20 seconds worth of brief seizure and I suspect that is what happened this man and it may have been influenced by the cold liquid that he drank just before the episode occurred.  Regardless I am very concerned that he is at high risk for recurrence and I told him not to drive an automobile for 6 months and see regulations he and his wife voiced understanding.  I advised him to have implanted loop recorder and suspect that we will see sinus node dysfunction and he will require a pacemaker.  For further evaluation I also asked him to have a myocardial perfusion study to assure [place] that he does not have severe CAD.  His wife agrees he is hesitant and I am not sure if he will follow through.  We will let him sit in my office for the next 5 to 10 minutes allow the 2 of them to think. 2. Stable hypertension continue ACE inhibitor  Next appointment   Medication Adjustments/Labs and Tests Ordered: Current medicines are reviewed at length with the patient today.  Concerns regarding medicines are outlined above.  No orders of the defined types were placed in this encounter.  No orders of the defined types were placed in this encounter.    No chief complaint on file. 6 to 8 weeks with me if agreeable I will set him up for his loop recorder Dr. Lezlie Octave  History of Present Illness:    Steven Kennedy is a 68 y.o. male who is being seen today for the evaluation of syncope at the request of Maryella Shivers, MD.   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  They went and sat down he took a little bit of the cold liquid said he felt weak tried to get to the sink she said he was white diaphoretic and he fell to the ground.  He became rigid his eyes rolled back in his head he was in extended posture and she said for about 1 minute she had a tonic clonic seizure.  He did not have bowel or urinary incontinence did not bite his tongue but afterward she said he had a prolonged period of confusion and has marked amnesia for the episode.  Has never had syncope in the past he has never had a seizure disorder no history of head trauma.  I do not have the records but his wife says that he EEG was normal she told me he had an echocardiogram at [facility] he quickly stopped access the report and it showed no evidence of cardiomyopathy coronary disease or valvular dysfunction.  He also wore an event monitor with life telemetry and a set at 1:40 AM they recall that a heart rate of 39 bpm  or trying to get the report sent to my office but I cannot tell you whether it was sinus bradycardia escape rhythm or heart block.  Since then he has had no recurrence but has not returned to work.  He has no exercise intolerance chest pain shortness of breath and no palpitation.  After the visit I did receive the 30-day event monitor.  Rhythm was sinus throughout there was no complex arrhythmia.  There is a strip on 03/26/2019 which shows a nonconducted P wave and a pauses lasting 2-1/2 seconds the QRS duration is not prolonged but it is not typical [place] Amber.  There are other instances of atrial premature beats and it may be a nonconducted APC.  There are no episodes of sinus node exit block Past Medical History:  Diagnosis Date  . Dyslipidemia   .  Hypertension   . Sinus bradycardia   . Skin cancer 2007  . Syncope and collapse     Past Surgical History:  Procedure Laterality Date  . SKIN CANCER EXCISION    . TOTAL HIP ARTHROPLASTY      Current Medications: Current Meds  Medication Sig  . amoxicillin (AMOXIL) 500 MG tablet TAKE ALL 4 PILLS ONE HOUR BEFORE APPT  . aspirin EC 81 MG tablet Take 81 mg by mouth daily.  . Cyanocobalamin (VITAMIN B12 PO) Take 1 tablet by mouth daily.  Marland Kitchen lisinopril (ZESTRIL) 10 MG tablet Take 10 mg by mouth daily.  . Magnesium Gluconate (MAGNESIUM 27 PO) Take 2 tablets by mouth daily.  . meloxicam (MOBIC) 7.5 MG tablet Take 1 tablet by mouth daily.  . Multiple Vitamin (MULTIVITAMIN) tablet Take 1 tablet by mouth daily.  . Multiple Vitamins-Minerals (ZINC PO) Take 2 tablets by mouth daily.  . Omega-3 Fatty Acids (FISH OIL PO) Take 1 tablet by mouth daily.   . rosuvastatin (CRESTOR) 10 MG tablet Take 10 mg by mouth once a week.  . vitamin C (ASCORBIC ACID) 500 MG tablet Take 1,500 mg by mouth daily.     Allergies:   Patient has no known allergies.   Social History   Socioeconomic History  . Marital status: Married    Spouse name: Not on file  . Number of children: Not on file  . Years of education: Not on file  . Highest education level: Not on file  Occupational History  . Not on file  Social Needs  . Financial resource strain: Not on file  . Food insecurity    Worry: Not on file    Inability: Not on file  . Transportation needs    Medical: Not on file    Non-medical: Not on file  Tobacco Use  . Smoking status: Former Smoker    Packs/day: 0.50    Years: 25.00    Pack years: 12.50    Types: Cigarettes    Quit date: 1982    Years since quitting: 38.8  . Smokeless tobacco: Former Systems developer    Types: Chew    Quit date: 1982  Substance and Sexual Activity  . Alcohol use: Not Currently  . Drug use: Not Currently  . Sexual activity: Not on file  Lifestyle  . Physical activity    Days  per week: Not on file    Minutes per session: Not on file  . Stress: Not on file  Relationships  . Social Herbalist on phone: Not on file    Gets together: Not on file    Attends religious  service: Not on file    Active member of club or organization: Not on file    Attends meetings of clubs or organizations: Not on file    Relationship status: Not on file  Other Topics Concern  . Not on file  Social History Narrative  . Not on file     Family History: The patient's family history includes Dementia in his paternal grandmother; Heart attack in his father, mother, and paternal grandfather; Hyperlipidemia in his father, mother, sister, and sister; Hypertension in his father, mother, sister, and sister; Stroke in his father.  ROS:   Review of Systems  Constitution: Negative.  HENT: Negative.   Eyes: Negative.   Cardiovascular: Positive for syncope.  Respiratory: Negative.   Endocrine: Negative.   Hematologic/Lymphatic: Negative.   Skin: Negative.   Musculoskeletal: Negative.   Gastrointestinal: Negative.   Neurological: Positive for seizures.  Psychiatric/Behavioral: Negative.   Allergic/Immunologic: Negative.    Please see the history of present illness.     All other systems reviewed and are negative.  EKGs/Labs/Other Studies Reviewed:    The following studies were reviewed today:   EKG:  EKG is  ordered today.  The ekg ordered today is personally reviewed and demonstrates sinus bradycardia 54 bpm first-degree AV block otherwise normal QRS morphology is normal  Recent Labs: No results found for requested labs within last 8760 hours.  Recent Lipid Panel No results found for: CHOL, TRIG, HDL, CHOLHDL, VLDL, LDLCALC, LDLDIRECT  Physical Exam:    VS:  There were no vitals taken for this visit.    Wt Readings from Last 3 Encounters:  No data found for Wt     GEN:  Well nourished, well developed in no acute distress HEENT: Normal NECK: No JVD; No carotid  bruits LYMPHATICS: No lymphadenopathy CARDIAC: RRR, no murmurs, rubs, gallops RESPIRATORY:  Clear to auscultation without rales, wheezing or rhonchi  ABDOMEN: Soft, non-tender, non-distended MUSCULOSKELETAL:  No edema; No deformity  SKIN: Warm and dry NEUROLOGIC:  Alert and oriented x 3 PSYCHIATRIC:  Normal affect     Signed, Shirlee More, MD  05/23/2019 9:49 AM    [place]

## 2019-05-23 NOTE — Addendum Note (Signed)
Addended by: Austin Miles on: 05/23/2019 02:07 PM   Modules accepted: Orders

## 2019-05-23 NOTE — Patient Instructions (Addendum)
Medication Instructions:  Your physician recommends that you continue on your current medications as directed. Please refer to the Current Medication list given to you today.  *If you need a refill on your cardiac medications before your next appointment, please call your pharmacy*  Lab Work: None  If you have labs (blood work) drawn today and your tests are completely normal, you will receive your results only by: Marland Kitchen MyChart Message (if you have MyChart) OR . A paper copy in the mail If you have any lab test that is abnormal or we need to change your treatment, we will call you to review the results.  Testing/Procedures: You had an EKG today.   Your physician has requested that you have a lexiscan myoview. For further information please visit [email or web address]. Please follow instruction sheet, as given.  You have been referred to see an electrophysiologist, Dr. Curt Bears, to set up loop recorder placement. You will return to the [place] office on Monday, 06/16/2019, at 10:30 am for an appointment.   Follow-Up: At [facility], you and your health needs are our priority.  As part of our continuing mission to provide you with exceptional heart care, we have created designated Provider Care Teams.  These Care Teams include your primary Cardiologist (physician) and Advanced Practice Providers (APPs -  Physician Assistants and Nurse Practitioners) who all work together to provide you with the care you need, when you need it.  Your next appointment:   8 weeks  The format for your next appointment:   In Person  Provider:   Shirlee More, MD    Cardiac Nuclear Scan A cardiac nuclear scan is a test that is done to check the flow of blood to your heart. It is done when you are resting and when you are exercising. The test looks for problems such as:  Not enough blood reaching a portion of the heart.  The heart muscle not working as it should. You may need this test if:  You have  heart disease.  You have had lab results that are not normal.  You have had heart surgery or a balloon procedure to open up blocked arteries (angioplasty).  You have chest pain.  You have shortness of breath. In this test, a special dye (tracer) is put into your bloodstream. The tracer will travel to your heart. A camera will then take pictures of your heart to see how the tracer moves through your heart. This test is usually done at a hospital and takes 2-4 hours. Tell a doctor about:  Any allergies you have.  All medicines you are taking, including vitamins, herbs, eye drops, creams, and over-the-counter medicines.  Any problems you or family members have had with anesthetic medicines.  Any blood disorders you have.  Any surgeries you have had.  Any medical conditions you have.  Whether you are pregnant or may be pregnant. What are the risks? Generally, this is a safe test. However, problems may occur, such as:  Serious chest pain and heart attack. This is only a risk if the stress portion of the test is done.  Rapid heartbeat.  A feeling of warmth in your chest. This feeling usually does not last long.  Allergic reaction to the tracer. What happens before the test?  Ask your doctor about changing or stopping your normal medicines. This is important.  Follow instructions from your doctor about what you cannot eat or drink.  Remove your jewelry on the day of the test. What happens  during the test?  An IV tube will be inserted into one of your veins.  Your doctor will give you a small amount of tracer through the IV tube.  You will wait for 20-40 minutes while the tracer moves through your bloodstream.  Your heart will be monitored with an electrocardiogram (ECG).  You will lie down on an exam table.  Pictures of your heart will be taken for about 15-20 minutes.  You may also have a stress test. For this test, one of these things may be done: ? You will be  asked to exercise on a treadmill or a stationary bike. ? You will be given medicines that will make your heart work harder. This is done if you are unable to exercise.  When blood flow to your heart has peaked, a tracer will again be given through the IV tube.  After 20-40 minutes, you will get back on the exam table. More pictures will be taken of your heart.  Depending on the tracer that is used, more pictures may need to be taken 3-4 hours later.  Your IV tube will be removed when the test is over. The test may vary among doctors and hospitals. What happens after the test?  Ask your doctor: ? Whether you can return to your normal schedule, including diet, activities, and medicines. ? Whether you should drink more fluids. This will help to remove the tracer from your body. Drink enough fluid to keep your pee (urine) pale yellow.  Ask your doctor, or the department that is doing the test: ? When will my results be ready? ? How will I get my results? Summary  A cardiac nuclear scan is a test that is done to check the flow of blood to your heart.  Tell your doctor whether you are pregnant or may be pregnant.  Before the test, ask your doctor about changing or stopping your normal medicines. This is important.  Ask your doctor whether you can return to your normal activities. You may be asked to drink more fluids. This information is not intended to replace advice given to you by your health care provider. Make sure you discuss any questions you have with your health care provider. Document Released: 12/10/2017 Document Revised: 10/16/2018 Document Reviewed: 12/10/2017 Elsevier Patient Education  2020 [employer]  An implantable loop recorder is a small electronic device that is placed under the skin of your chest. It is about the size of an AA ("double A") battery. The device records the electrical activity of your heart over a long  period of time. Your health care provider can download these recordings to monitor your heart. You may need an implantable loop recorder if you have periods of abnormal heart activity (arrhythmias) or unexplained fainting (syncope). The recorder can be left in place for 1 year or longer. Tell a health care provider about:  Any allergies you have.  All medicines you are taking, including vitamins, herbs, eye drops, creams, and over-the-counter medicines.  Any problems you or family members have had with anesthetic medicines.  Any blood disorders you have.  Any surgeries you have had.  Any medical conditions you have.  Whether you are pregnant or may be pregnant. What are the risks? Generally, this is a safe procedure. However, problems may occur, including:  Infection.  Bleeding.  Allergic reactions to anesthetic medicines.  Damage to nerves or blood vessels.  Failure of the device to work.  This could require another surgery to replace it. What happens before the procedure?   You may have a physical exam, blood tests, and imaging tests of your heart, such as a chest X-ray.  Follow instructions from your health care provider about eating or drinking restrictions.  Ask your health care provider about: ? Changing or stopping your regular medicines. This is especially important if you are taking diabetes medicines or blood thinners. ? Taking medicines such as aspirin and ibuprofen. These medicines can thin your blood. Do not take these medicines unless your health care provider tells you to take them. ? Taking over-the-counter medicines, vitamins, herbs, and supplements.  Ask your health care provider how your surgical site will be marked or identified.  Ask your health care provider what steps will be taken to help prevent infection. These may include: ? Removing hair at the surgery site. ? Washing skin with a germ-killing soap.  Plan to have someone take you home from the  hospital or clinic.  Plan to have a responsible adult care for you for at least 24 hours after you leave the hospital or clinic. This is important.  Do not use any products that contain nicotine or tobacco, such as cigarettes and e-cigarettes. If you need help quitting, ask your health care provider. What happens during the procedure?  An IV will be inserted into one of your veins.  You may be given one or more of the following: ? A medicine to help you relax (sedative). ? A medicine to numb the area (local anesthetic).  A small incision will be made on the left side of your upper chest.  A pocket will be created under your skin.  The device will be placed in the pocket.  The incision will be closed with stitches (sutures) or adhesive strips.  A bandage (dressing) will be placed over the incision. The procedure may vary among health care providers and hospitals. What happens after the procedure?  Your blood pressure, heart rate, breathing rate, and blood oxygen level will be monitored until you leave the hospital or clinic.  You may be able to go home on the day of your surgery. Before you go home: ? Your health care provider will program your recorder. ? You will learn how to trigger your device with a handheld activator. ? You will learn how to send recordings to your health care provider. ? You will get an ID card for your device, and you will be told when to use it.  Do not drive for 24 hours if you were given a sedative during your procedure. Summary  An implantable loop recorder is a small electronic device that is placed under the skin of your chest to monitor your heart over a long period of time.  The recorder can be left in place for 1 year or longer.  Plan to have someone take you home from the hospital or clinic. This information is not intended to replace advice given to you by your health care provider. Make sure you discuss any questions you have with your  health care provider. Document Released: 06/07/2015 Document Revised: 08/30/2017 Document Reviewed: 08/11/2017 Elsevier Patient Education  2020 [employer].

## 2019-05-27 ENCOUNTER — Telehealth (HOSPITAL_COMMUNITY): Payer: Self-pay | Admitting: *Deleted

## 2019-05-27 NOTE — Telephone Encounter (Signed)
Patient given detailed instructions per Myocardial Perfusion Study Information Sheet for the test on  05/29/19. Patient notified to arrive 15 minutes early and that it is imperative to arrive on time for appointment to keep from having the test rescheduled.  If you need to cancel or reschedule your appointment, please call the office within 24 hours of your appointment. . Patient verbalized understanding. Steven Kennedy

## 2019-05-29 ENCOUNTER — Other Ambulatory Visit: Payer: Self-pay

## 2019-05-29 ENCOUNTER — Ambulatory Visit (INDEPENDENT_AMBULATORY_CARE_PROVIDER_SITE_OTHER): Payer: PPO

## 2019-05-29 DIAGNOSIS — R079 Chest pain, unspecified: Secondary | ICD-10-CM | POA: Diagnosis not present

## 2019-05-29 LAB — MYOCARDIAL PERFUSION IMAGING
LV dias vol: 138 mL (ref 62–150)
LV sys vol: 63 mL
Peak HR: 60 {beats}/min
Rest HR: 44 {beats}/min
SDS: 3
SRS: 3
SSS: 6
TID: 1.19

## 2019-05-29 MED ORDER — TECHNETIUM TC 99M TETROFOSMIN IV KIT
10.9000 | PACK | Freq: Once | INTRAVENOUS | Status: AC | PRN
  Administered 2019-05-29: 10.9 via INTRAVENOUS

## 2019-05-29 MED ORDER — TECHNETIUM TC 99M TETROFOSMIN IV KIT
32.5000 | PACK | Freq: Once | INTRAVENOUS | Status: AC | PRN
  Administered 2019-05-29: 32.5 via INTRAVENOUS

## 2019-05-29 MED ORDER — REGADENOSON 0.4 MG/5ML IV SOLN
0.4000 mg | Freq: Once | INTRAVENOUS | Status: AC
  Administered 2019-05-29: 0.4 mg via INTRAVENOUS

## 2019-06-03 NOTE — Progress Notes (Signed)
Cardiology Office Note:    Date:  06/04/2019   ID:  Steven Kennedy, DOB 02/25/1951, MRN [id number]  PCP:  Maryella Shivers, MD  Cardiologist:  Shirlee More, MD    Referring MD: Maryella Shivers, MD    ASSESSMENT:    1. Abnormal myocardial perfusion study   2. Syncope and collapse   3. Essential hypertension   4. Hyperlipidemia, unspecified hyperlipidemia type    PLAN:    In order of problems listed above:  1. In the setting of syncope post exertion abnormal myocardial perfusion study after discussion and shared decision making we will proceed to coronary angiography and possible revascularization.  Risk benefits and options detailed informed consent obtained.  If he does not have significant CAD I would favor referral for implanted loop recorder 2. Stable continue current treatment lisinopril 3. Continue his high intensity statin.   Next appointment: 6 weeks   Medication Adjustments/Labs and Tests Ordered: Current medicines are reviewed at length with the patient today.  Concerns regarding medicines are outlined above.  No orders of the defined types were placed in this encounter.  No orders of the defined types were placed in this encounter.   Chief Complaint  Patient presents with  . Follow-up    His myocardial perfusion study was abnormal showing ischemia in the distribution of the right coronary artery in the setting of a recent profound postexertional syncopal episode with a brief seizure.    History of Present Illness:    Steven Kennedy is a 68 y.o. male with a hx of exertional syncope last seen 05/23/2019.I did receive the 30-day event monitor. Rhythm was sinus throughout there was no complex arrhythmia. There is a strip on 03/26/2019 which shows a nonconducted P wave and a pauses lasting 2-1/2 seconds the QRS duration is not prolonged but it is not typical Darden Amber. There are other instances of atrial premature beats and it may be a nonconducted APC. There are  no episodes of sinus node exit block   Compliance with diet, lifestyle and medications: Yes  I reviewed the results of his myocardial perfusion study showing the presence of coronary ischemia and the profound episode of syncope he had after exertion including brief ictal episode.  I suspect he has severe CAD I reviewed the risks benefits and options and advised him to undergo coronary angiography.  He agrees he has no dye allergy or contraindication to dual antiplatelet therapy.  He has had no recurrent syncope and has had no chest pain exercise intolerance shortness of breath or palpitation.  His cardiovascular risk factors include hypertension hyperlipidemia and he has xanthelasma and I suspect has baseline severe dyslipidemia.   Nuclear Stress Findings  Isotope administration Rest isotope was administered  with an IV injection of 10.9 mCi Tc51m Tetrofosmin.  Rest SPECT images were obtained approximately 45 minutes post tracer injection.  Stress isotope was administered  with an IV injection of 32.5 mCi Tc59m Tetrofosmin   Stress SPECT images were obtained approximately 60 minutes post tracer injection.  Nuclear Study Quality Overall image quality is excellent.  There is no nuclear artifact present.  Nuclear Measurements Study was gated.  Rest Perfusion Rest perfusion normal.  Stress Perfusion Stress perfusion abnormal.   There is a defect present in the basal inferior location.  Perfusion Summary Defect 1:  There is a small defect of mild severity present in the basal inferior location. The defect is reversible.  Overall Study Impression Myocardial perfusion is abnormal.  Findings consistent  with ischemia.  This is an intermediate risk study.  Overall left ventricular systolic function was normal.    LV cavity size is mildly enlarged.  Nuclear stress EF:  55%.  The left ventricular ejection fraction is normal (55-65%).     Stress Findings  ECG Baseline ECG indicates sinus bradycardia. .   Stress Findings A pharmacological stress test was performed using IV Lexiscan 0.4mg  over 10 seconds performed without concurrent submaximal exercise.   The patient reported no symptoms during the stress test.   Test was stopped per protocol.  Response to Stress Arrhythmias during stress:  sinus bradycardia.   Arrhythmias during recovery:  none.     There were no significant arrhythmias noted during the test.   ECG was interpretable and there was no significant change from baseline     Past Medical History:  Diagnosis Date  . Dyslipidemia   . Hypertension   . Sinus bradycardia   . Skin cancer 2007  . Syncope and collapse     Past Surgical History:  Procedure Laterality Date  . SKIN CANCER EXCISION    . TOTAL HIP ARTHROPLASTY      Current Medications: Current Meds  Medication Sig  . amoxicillin (AMOXIL) 500 MG tablet TAKE ALL 4 PILLS ONE HOUR BEFORE APPT  . aspirin EC 81 MG tablet Take 81 mg by mouth daily.  . Cyanocobalamin (VITAMIN B12 PO) Take 1 tablet by mouth daily.  Marland Kitchen lisinopril (ZESTRIL) 10 MG tablet Take 10 mg by mouth daily.  . Magnesium Gluconate (MAGNESIUM 27 PO) Take 2 tablets by mouth daily.  . meloxicam (MOBIC) 7.5 MG tablet Take 15 mg by mouth daily.   . Multiple Vitamin (MULTIVITAMIN) tablet Take 1 tablet by mouth daily.  . Multiple Vitamins-Minerals (ZINC PO) Take 2 tablets by mouth daily.  . Omega-3 Fatty Acids (FISH OIL PO) Take 1 tablet by mouth daily.   . rosuvastatin (CRESTOR) 10 MG tablet Take 10 mg by mouth once a week.     Allergies:   Patient has no known allergies.   Social History   Socioeconomic History  . Marital status: Married    Spouse name: Not on file  . Number of children: Not on file  . Years of education: Not on file  . Highest education level: Not on file  Occupational History  . Not on file  Social Needs  . Financial resource strain: Not on file  . Food insecurity    Worry: Not on file    Inability: Not on file  .  Transportation needs    Medical: Not on file    Non-medical: Not on file  Tobacco Use  . Smoking status: Former Smoker    Packs/day: 0.50    Years: 25.00    Pack years: 12.50    Types: Cigarettes    Quit date: 1982    Years since quitting: 38.9  . Smokeless tobacco: Former Systems developer    Types: Chew    Quit date: 1982  Substance and Sexual Activity  . Alcohol use: Not Currently  . Drug use: Not Currently  . Sexual activity: Not on file  Lifestyle  . Physical activity    Days per week: Not on file    Minutes per session: Not on file  . Stress: Not on file  Relationships  . Social Herbalist on phone: Not on file    Gets together: Not on file    Attends religious service: Not on file  Active member of club or organization: Not on file    Attends meetings of clubs or organizations: Not on file    Relationship status: Not on file  Other Topics Concern  . Not on file  Social History Narrative  . Not on file     Family History: The patient's family history includes Dementia in his paternal grandmother; Heart attack in his father, mother, and paternal grandfather; Hyperlipidemia in his father, mother, sister, and sister; Hypertension in his father, mother, sister, and sister; Stroke in his father. ROS:   Please see the history of present illness.    All other systems reviewed and are negative.  EKGs/Labs/Other Studies Reviewed:    The following studies were reviewed today:  EKG:  EKG 05/23/2019 personally reviewed sinus rhythm small insignificant inferior Q waves no ischemic changes otherwise normal  Recent Labs: No results found for requested labs within last 8760 hours.  Recent Lipid Panel No results found for: CHOL, TRIG, HDL, CHOLHDL, VLDL, LDLCALC, LDLDIRECT  Physical Exam:    VS:  BP (!) 158/94 (BP Location: Right Arm, Patient Position: Sitting, Cuff Size: Normal)   Pulse (!) 50   Ht 6\' 1"  (1.854 m)   Wt 227 lb 6.4 oz (103.1 kg)   SpO2 99%   BMI 30.00  kg/m     Wt Readings from Last 3 Encounters:  06/04/19 227 lb 6.4 oz (103.1 kg)  05/29/19 222 lb (100.7 kg)     GEN:  Well nourished, well developed in no acute distress HEENT: Normal NECK: No JVD; No carotid bruits LYMPHATICS: No lymphadenopathy CARDIAC: RRR, no murmurs, rubs, gallops RESPIRATORY:  Clear to auscultation without rales, wheezing or rhonchi  ABDOMEN: Soft, non-tender, non-distended MUSCULOSKELETAL:  No edema; No deformity  SKIN: Warm and dry NEUROLOGIC:  Alert and oriented x 3 PSYCHIATRIC:  Normal affect    Signed, Shirlee More, MD  06/04/2019 9:09 AM    [place]

## 2019-06-03 NOTE — H&P (View-Only) (Signed)
Cardiology Office Note:    Date:  06/04/2019   ID:  Steven Kennedy, DOB Nov 25, 1950, MRN [id number]  PCP:  Maryella Shivers, MD  Cardiologist:  Shirlee More, MD    Referring MD: Maryella Shivers, MD    ASSESSMENT:    1. Abnormal myocardial perfusion study   2. Syncope and collapse   3. Essential hypertension   4. Hyperlipidemia, unspecified hyperlipidemia type    PLAN:    In order of problems listed above:  1. In the setting of syncope post exertion abnormal myocardial perfusion study after discussion and shared decision making we will proceed to coronary angiography and possible revascularization.  Risk benefits and options detailed informed consent obtained.  If he does not have significant CAD I would favor referral for implanted loop recorder 2. Stable continue current treatment lisinopril 3. Continue his high intensity statin.   Next appointment: 6 weeks   Medication Adjustments/Labs and Tests Ordered: Current medicines are reviewed at length with the patient today.  Concerns regarding medicines are outlined above.  No orders of the defined types were placed in this encounter.  No orders of the defined types were placed in this encounter.   Chief Complaint  Patient presents with  . Follow-up    His myocardial perfusion study was abnormal showing ischemia in the distribution of the right coronary artery in the setting of a recent profound postexertional syncopal episode with a brief seizure.    History of Present Illness:    Steven Kennedy is a 68 y.o. male with a hx of exertional syncope last seen 05/23/2019.I did receive the 30-day event monitor. Rhythm was sinus throughout there was no complex arrhythmia. There is a strip on 03/26/2019 which shows a nonconducted P wave and a pauses lasting 2-1/2 seconds the QRS duration is not prolonged but it is not typical Darden Amber. There are other instances of atrial premature beats and it may be a nonconducted APC. There are  no episodes of sinus node exit block   Compliance with diet, lifestyle and medications: Yes  I reviewed the results of his myocardial perfusion study showing the presence of coronary ischemia and the profound episode of syncope he had after exertion including brief ictal episode.  I suspect he has severe CAD I reviewed the risks benefits and options and advised him to undergo coronary angiography.  He agrees he has no dye allergy or contraindication to dual antiplatelet therapy.  He has had no recurrent syncope and has had no chest pain exercise intolerance shortness of breath or palpitation.  His cardiovascular risk factors include hypertension hyperlipidemia and he has xanthelasma and I suspect has baseline severe dyslipidemia.   Nuclear Stress Findings  Isotope administration Rest isotope was administered  with an IV injection of 10.9 mCi Tc79m Tetrofosmin.  Rest SPECT images were obtained approximately 45 minutes post tracer injection.  Stress isotope was administered  with an IV injection of 32.5 mCi Tc72m Tetrofosmin   Stress SPECT images were obtained approximately 60 minutes post tracer injection.  Nuclear Study Quality Overall image quality is excellent.  There is no nuclear artifact present.  Nuclear Measurements Study was gated.  Rest Perfusion Rest perfusion normal.  Stress Perfusion Stress perfusion abnormal.   There is a defect present in the basal inferior location.  Perfusion Summary Defect 1:  There is a small defect of mild severity present in the basal inferior location. The defect is reversible.  Overall Study Impression Myocardial perfusion is abnormal.  Findings consistent  with ischemia.  This is an intermediate risk study.  Overall left ventricular systolic function was normal.    LV cavity size is mildly enlarged.  Nuclear stress EF:  55%.  The left ventricular ejection fraction is normal (55-65%).     Stress Findings  ECG Baseline ECG indicates sinus bradycardia. .   Stress Findings A pharmacological stress test was performed using IV Lexiscan 0.4mg  over 10 seconds performed without concurrent submaximal exercise.   The patient reported no symptoms during the stress test.   Test was stopped per protocol.  Response to Stress Arrhythmias during stress:  sinus bradycardia.   Arrhythmias during recovery:  none.     There were no significant arrhythmias noted during the test.   ECG was interpretable and there was no significant change from baseline     Past Medical History:  Diagnosis Date  . Dyslipidemia   . Hypertension   . Sinus bradycardia   . Skin cancer 2007  . Syncope and collapse     Past Surgical History:  Procedure Laterality Date  . SKIN CANCER EXCISION    . TOTAL HIP ARTHROPLASTY      Current Medications: Current Meds  Medication Sig  . amoxicillin (AMOXIL) 500 MG tablet TAKE ALL 4 PILLS ONE HOUR BEFORE APPT  . aspirin EC 81 MG tablet Take 81 mg by mouth daily.  . Cyanocobalamin (VITAMIN B12 PO) Take 1 tablet by mouth daily.  Marland Kitchen lisinopril (ZESTRIL) 10 MG tablet Take 10 mg by mouth daily.  . Magnesium Gluconate (MAGNESIUM 27 PO) Take 2 tablets by mouth daily.  . meloxicam (MOBIC) 7.5 MG tablet Take 15 mg by mouth daily.   . Multiple Vitamin (MULTIVITAMIN) tablet Take 1 tablet by mouth daily.  . Multiple Vitamins-Minerals (ZINC PO) Take 2 tablets by mouth daily.  . Omega-3 Fatty Acids (FISH OIL PO) Take 1 tablet by mouth daily.   . rosuvastatin (CRESTOR) 10 MG tablet Take 10 mg by mouth once a week.     Allergies:   Patient has no known allergies.   Social History   Socioeconomic History  . Marital status: Married    Spouse name: Not on file  . Number of children: Not on file  . Years of education: Not on file  . Highest education level: Not on file  Occupational History  . Not on file  Social Needs  . Financial resource strain: Not on file  . Food insecurity    Worry: Not on file    Inability: Not on file  .  Transportation needs    Medical: Not on file    Non-medical: Not on file  Tobacco Use  . Smoking status: Former Smoker    Packs/day: 0.50    Years: 25.00    Pack years: 12.50    Types: Cigarettes    Quit date: 1982    Years since quitting: 38.9  . Smokeless tobacco: Former Systems developer    Types: Chew    Quit date: 1982  Substance and Sexual Activity  . Alcohol use: Not Currently  . Drug use: Not Currently  . Sexual activity: Not on file  Lifestyle  . Physical activity    Days per week: Not on file    Minutes per session: Not on file  . Stress: Not on file  Relationships  . Social Herbalist on phone: Not on file    Gets together: Not on file    Attends religious service: Not on file  Active member of club or organization: Not on file    Attends meetings of clubs or organizations: Not on file    Relationship status: Not on file  Other Topics Concern  . Not on file  Social History Narrative  . Not on file     Family History: The patient's family history includes Dementia in his paternal grandmother; Heart attack in his father, mother, and paternal grandfather; Hyperlipidemia in his father, mother, sister, and sister; Hypertension in his father, mother, sister, and sister; Stroke in his father. ROS:   Please see the history of present illness.    All other systems reviewed and are negative.  EKGs/Labs/Other Studies Reviewed:    The following studies were reviewed today:  EKG:  EKG 05/23/2019 personally reviewed sinus rhythm small insignificant inferior Q waves no ischemic changes otherwise normal  Recent Labs: No results found for requested labs within last 8760 hours.  Recent Lipid Panel No results found for: CHOL, TRIG, HDL, CHOLHDL, VLDL, LDLCALC, LDLDIRECT  Physical Exam:    VS:  BP (!) 158/94 (BP Location: Right Arm, Patient Position: Sitting, Cuff Size: Normal)   Pulse (!) 50   Ht 6\' 1"  (1.854 m)   Wt 227 lb 6.4 oz (103.1 kg)   SpO2 99%   BMI 30.00  kg/m     Wt Readings from Last 3 Encounters:  06/04/19 227 lb 6.4 oz (103.1 kg)  05/29/19 222 lb (100.7 kg)     GEN:  Well nourished, well developed in no acute distress HEENT: Normal NECK: No JVD; No carotid bruits LYMPHATICS: No lymphadenopathy CARDIAC: RRR, no murmurs, rubs, gallops RESPIRATORY:  Clear to auscultation without rales, wheezing or rhonchi  ABDOMEN: Soft, non-tender, non-distended MUSCULOSKELETAL:  No edema; No deformity  SKIN: Warm and dry NEUROLOGIC:  Alert and oriented x 3 PSYCHIATRIC:  Normal affect    Signed, Shirlee More, MD  06/04/2019 9:09 AM    [place]

## 2019-06-04 ENCOUNTER — Other Ambulatory Visit: Payer: Self-pay

## 2019-06-04 ENCOUNTER — Ambulatory Visit (INDEPENDENT_AMBULATORY_CARE_PROVIDER_SITE_OTHER): Payer: PPO | Admitting: Cardiology

## 2019-06-04 VITALS — BP 158/94 | HR 50 | Ht 73.0 in | Wt 227.4 lb

## 2019-06-04 DIAGNOSIS — R9439 Abnormal result of other cardiovascular function study: Secondary | ICD-10-CM

## 2019-06-04 DIAGNOSIS — R55 Syncope and collapse: Secondary | ICD-10-CM

## 2019-06-04 DIAGNOSIS — Z01818 Encounter for other preprocedural examination: Secondary | ICD-10-CM | POA: Insufficient documentation

## 2019-06-04 DIAGNOSIS — E785 Hyperlipidemia, unspecified: Secondary | ICD-10-CM

## 2019-06-04 DIAGNOSIS — I10 Essential (primary) hypertension: Secondary | ICD-10-CM | POA: Diagnosis not present

## 2019-06-04 HISTORY — DX: Encounter for other preprocedural examination: Z01.818

## 2019-06-04 NOTE — Patient Instructions (Signed)
Medication Instructions:  Your physician recommends that you continue on your current medications as directed. Please refer to the Current Medication list given to you today.  *If you need a refill on your cardiac medications before your next appointment, please call your pharmacy*  Lab Work: Your physician recommends that you return for lab work today: CBC, BMP.   If you have labs (blood work) drawn today and your tests are completely normal, you will receive your results only by: Marland Kitchen MyChart Message (if you have MyChart) OR . A paper copy in the mail If you have any lab test that is abnormal or we need to change your treatment, we will call you to review the results.  Testing/Procedures: A chest x-ray takes a picture of the organs and structures inside the chest, including the heart, lungs, and blood vessels. This test can show several things, including, whether the heart is enlarges; whether fluid is building up in the lungs; and whether pacemaker / defibrillator leads are still in place.   You will go for pre-procedural drive-thru COVID testing on Tuesday, 06/10/2019, at 12:50 pm at the [facility]. Please go to [place], [place] and arrive 15 minutes early. DO NOT go to the tent or the tent line. Go to the building overhang for testing and self-isolate after until your catheterization.      [place] [address] [place] [id number] Dept: [phone] Loc: [facility]  06/04/2019  You are scheduled for a Cardiac Catheterization on Friday, December 4 with Dr. [name].  1. Please arrive at the [facility] (Main Entrance A) at [facility]: [address] [place], [place] [address] at 8:30 AM (This time is two hours before your procedure to ensure your preparation). Free valet parking service is available.   Special note: Every effort is made  to have your procedure done on time. Please understand that emergencies sometimes delay scheduled procedures.  2. Diet: Do not eat solid foods after midnight.  The patient may have clear liquids until 5am upon the day of the procedure.  3. Labs: None  4. Medication instructions in preparation for your procedure:   Contrast Allergy: No  On the morning of your procedure, take your Aspirin and any morning medicines NOT listed above.  You may use sips of water.  5. Plan for one night stay--bring personal belongings. 6. Bring a current list of your medications and current insurance cards. 7. You MUST have a responsible person to drive you home. 8. Someone MUST be with you the first 24 hours after you arrive home or your discharge will be delayed. 9. Please wear clothes that are easy to get on and off and wear slip-on shoes.  Thank you for allowing [place] to care for you!   -- [facility]   Follow-Up: At [facility], you and your health needs are our priority.  As part of our continuing mission to provide you with exceptional heart care, we have created designated Provider Care Teams.  These Care Teams include your primary Cardiologist (physician) and Advanced Practice Providers (APPs -  Physician Assistants and Nurse Practitioners) who all work together to provide you with the care you need, when you need it.  Your next appointment:   6 week(s)  The format for your next appointment:   In Person  Provider:   Shirlee More, MD  Coronary Angiogram With Stent Coronary angiogram with stent placement is a procedure to widen or open a narrow blood vessel of the heart (coronary artery). Arteries may become blocked by cholesterol buildup (plaques) in the lining of the wall. When a coronary artery becomes partially blocked, blood flow to that area decreases. This may lead to chest pain or a heart attack (myocardial infarction). A stent is a small piece of  metal that looks like mesh or a spring. Stent placement may be done as treatment for a heart attack or right after a coronary angiogram in which a blocked artery is found. Let your health care provider know about:  Any allergies you have.  All medicines you are taking, including vitamins, herbs, eye drops, creams, and over-the-counter medicines.  Any problems you or family members have had with anesthetic medicines.  Any blood disorders you have.  Any surgeries you have had.  Any medical conditions you have.  Whether you are pregnant or may be pregnant. What are the risks? Generally, this is a safe procedure. However, problems may occur, including:  Damage to the heart or its blood vessels.  A return of blockage.  Bleeding, infection, or bruising at the insertion site.  A collection of blood under the skin (hematoma) at the insertion site.  A blood clot in another part of the body.  Kidney injury.  Allergic reaction to the dye or contrast that is used.  Bleeding into the abdomen (retroperitoneal bleeding). What happens before the procedure? Staying hydrated Follow instructions from your health care provider about hydration, which may include:  Up to 2 hours before the procedure - you may continue to drink clear liquids, such as water, clear fruit juice, black coffee, and plain tea.  Eating and drinking restrictions Follow instructions from your health care provider about eating and drinking, which may include:  8 hours before the procedure - stop eating heavy meals or foods such as meat, fried foods, or fatty foods.  6 hours before the procedure - stop eating light meals or foods, such as toast or cereal.  2 hours before the procedure - stop drinking clear liquids. Ask your health care provider about:  Changing or stopping your regular medicines. This is especially important if you are taking diabetes medicines or blood thinners.  Taking medicines such as  ibuprofen. These medicines can thin your blood. Do not take these medicines before your procedure if your health care provider instructs you not to. Generally, aspirin is recommended before a procedure of passing a small, thin tube (catheter) through a blood vessel and into the heart (cardiac catheterization). What happens during the procedure?   An IV tube will be inserted into one of your veins.  You will be given one or more of the following: ? A medicine to help you relax (sedative). ? A medicine to numb the area where the catheter will be inserted into an artery (local anesthetic).  To reduce your risk of infection: ? Your health care team will wash or sanitize their hands. ? Your skin will be washed with soap. ? Hair may be removed from the area where the catheter will be inserted.  Using a guide wire, the catheter will be inserted into an artery. The location may be in your groin, in your wrist, or in the fold of your arm (near your elbow).  A type of X-ray (fluoroscopy) will be used to help guide the catheter to the opening of the arteries in the heart.  A  dye will be injected into the catheter, and X-rays will be taken. The dye will help to show where any narrowing or blockages are located in the arteries.  A tiny wire will be guided to the blocked spot, and a balloon will be inflated to make the artery wider.  The stent will be expanded and will crush the plaques into the wall of the vessel. The stent will hold the area open and improve the blood flow. Most stents have a drug coating to reduce the risk of the stent narrowing over time.  The artery may be made wider using a drill, laser, or other tools to remove plaques.  When the blood flow is better, the catheter will be removed. The lining of the artery will grow over the stent, which stays where it was placed. This procedure may vary among health care providers and hospitals. What happens after the procedure?  If the  procedure is done through the leg, you will be kept in bed lying flat for about 6 hours. You will be instructed to not bend and not cross your legs.  The insertion site will be checked frequently.  The pulse in your foot or wrist will be checked frequently.  You may have additional blood tests, X-rays, and a test that records the electrical activity of your heart (electrocardiogram, or ECG). This information is not intended to replace advice given to you by your health care provider. Make sure you discuss any questions you have with your health care provider. Document Released: 12/31/2002 Document Revised: 10/05/2017 Document Reviewed: 01/30/2016 Elsevier Patient Education  2020 [employer].

## 2019-06-05 LAB — BASIC METABOLIC PANEL
BUN/Creatinine Ratio: 16 (ref 10–24)
BUN: 15 mg/dL (ref 8–27)
CO2: 24 mmol/L (ref 20–29)
Calcium: 9.5 mg/dL (ref 8.6–10.2)
Chloride: 104 mmol/L (ref 96–106)
Creatinine, Ser: 0.96 mg/dL (ref 0.76–1.27)
GFR calc Af Amer: 93 mL/min/{1.73_m2} (ref >59)
GFR calc non Af Amer: 80 mL/min/{1.73_m2} (ref >59)
Glucose: 86 mg/dL (ref 65–99)
Potassium: 5 mmol/L (ref 3.5–5.2)
Sodium: 140 mmol/L (ref 134–144)

## 2019-06-05 LAB — CBC
Hematocrit: 44.5 % (ref 37.5–51.0)
Hemoglobin: 15.5 g/dL (ref 13.0–17.7)
MCH: 35.3 pg — ABNORMAL HIGH (ref 26.6–33.0)
MCHC: 34.8 g/dL (ref 31.5–35.7)
MCV: 101 fL — ABNORMAL HIGH (ref 79–97)
Platelets: 196 10*3/uL (ref 150–450)
RBC: 4.39 x10E6/uL (ref 4.14–5.80)
RDW: 11.7 % (ref 11.6–15.4)
WBC: 5.5 10*3/uL (ref 3.4–10.8)

## 2019-06-10 ENCOUNTER — Other Ambulatory Visit (HOSPITAL_COMMUNITY)
Admission: RE | Admit: 2019-06-10 | Discharge: 2019-06-10 | Disposition: A | Discharge status: Home / Self Care | Payer: PPO | Source: Ambulatory Visit | Attending: Cardiology | Admitting: Cardiology

## 2019-06-10 DIAGNOSIS — Z01812 Encounter for preprocedural laboratory examination: Secondary | ICD-10-CM | POA: Diagnosis not present

## 2019-06-10 DIAGNOSIS — Z20828 Contact with and (suspected) exposure to other viral communicable diseases: Secondary | ICD-10-CM | POA: Insufficient documentation

## 2019-06-11 LAB — NOVEL CORONAVIRUS, NAA (HOSP ORDER, SEND-OUT TO REF LAB; TAT 18-24 HRS): SARS-CoV-2, NAA: NOT DETECTED

## 2019-06-12 ENCOUNTER — Telehealth: Payer: Self-pay | Admitting: *Deleted

## 2019-06-12 NOTE — Telephone Encounter (Signed)
Pt contacted pre-catheterization scheduled at [facility] for: Friday June 13, 2019 10:30 AM Verified arrival time and place: [facility]) at: 8:30 AM  No solid food after midnight prior to cath, clear liquids until 5 AM day of procedure. Contrast allergy:no   AM meds can be  taken pre-cath with sip of water including: ASA 81 mg   Confirmed patient has responsible adult to drive home post procedure and observe 24 hours after arriving home: yes  Currently, due to Covid-19 pandemic, only one support person will be allowed with patient. Must be the same support person for that patient's entire stay, will be screened and required to wear a mask. They will be asked to wait in the waiting room for the duration of the patient's stay.  Patients are required to wear a mask when they enter the hospital.      COVID-19 Pre-Screening Questions:  . In the past 7 to 10 days have you had a cough,  shortness of breath, headache, congestion, fever (100 or greater) body aches, chills, sore throat, or sudden loss of taste or sense of smell? no . Have you been around anyone with known Covid 19? no . Have you been around anyone who is awaiting Covid 19 test results in the past 7 to 10 days? no . Have you been around anyone who has been exposed to Covid 19, or has mentioned symptoms of Covid 19 within the past 7 to 10 days? no   I reviewed procedure/mask/visitor instructions, Covid-19 screening questions with patient, he verbalized understanding, thanked me for call.

## 2019-06-13 ENCOUNTER — Ambulatory Visit (HOSPITAL_COMMUNITY)
Admission: RE | Admit: 2019-06-13 | Discharge: 2019-06-13 | Disposition: A | Discharge status: Home / Self Care | Payer: PPO | Attending: Cardiology | Admitting: Cardiology

## 2019-06-13 ENCOUNTER — Encounter (HOSPITAL_COMMUNITY): Payer: Self-pay | Admitting: Physician Assistant

## 2019-06-13 ENCOUNTER — Other Ambulatory Visit: Payer: Self-pay

## 2019-06-13 ENCOUNTER — Telehealth: Payer: Self-pay | Admitting: Physician Assistant

## 2019-06-13 ENCOUNTER — Encounter (HOSPITAL_COMMUNITY)
Admission: RE | Disposition: A | Discharge status: Home / Self Care | Payer: Self-pay | Source: Home / Self Care | Attending: Cardiology

## 2019-06-13 DIAGNOSIS — Z791 Long term (current) use of non-steroidal anti-inflammatories (NSAID): Secondary | ICD-10-CM | POA: Insufficient documentation

## 2019-06-13 DIAGNOSIS — I10 Essential (primary) hypertension: Secondary | ICD-10-CM

## 2019-06-13 DIAGNOSIS — R9439 Abnormal result of other cardiovascular function study: Secondary | ICD-10-CM

## 2019-06-13 DIAGNOSIS — Z79899 Other long term (current) drug therapy: Secondary | ICD-10-CM | POA: Diagnosis not present

## 2019-06-13 DIAGNOSIS — Z95820 Peripheral vascular angioplasty status with implants and grafts: Secondary | ICD-10-CM

## 2019-06-13 DIAGNOSIS — R001 Bradycardia, unspecified: Secondary | ICD-10-CM

## 2019-06-13 DIAGNOSIS — Z7982 Long term (current) use of aspirin: Secondary | ICD-10-CM | POA: Insufficient documentation

## 2019-06-13 DIAGNOSIS — Z87891 Personal history of nicotine dependence: Secondary | ICD-10-CM | POA: Insufficient documentation

## 2019-06-13 DIAGNOSIS — I2510 Atherosclerotic heart disease of native coronary artery without angina pectoris: Secondary | ICD-10-CM

## 2019-06-13 DIAGNOSIS — R55 Syncope and collapse: Secondary | ICD-10-CM | POA: Diagnosis present

## 2019-06-13 DIAGNOSIS — E785 Hyperlipidemia, unspecified: Secondary | ICD-10-CM

## 2019-06-13 HISTORY — DX: Atherosclerotic heart disease of native coronary artery without angina pectoris: I25.10

## 2019-06-13 HISTORY — PX: LEFT HEART CATH AND CORONARY ANGIOGRAPHY: CATH118249

## 2019-06-13 HISTORY — DX: Essential (primary) hypertension: I10

## 2019-06-13 HISTORY — DX: Hyperlipidemia, unspecified: E78.5

## 2019-06-13 HISTORY — PX: CORONARY STENT INTERVENTION: CATH118234

## 2019-06-13 HISTORY — DX: Abnormal result of other cardiovascular function study: R94.39

## 2019-06-13 LAB — TSH: TSH: 2.628 u[IU]/mL (ref 0.350–4.500)

## 2019-06-13 LAB — HEPATIC FUNCTION PANEL
ALT: 24 U/L (ref 0–44)
AST: 24 U/L (ref 15–41)
Albumin: 4 g/dL (ref 3.5–5.0)
Alkaline Phosphatase: 57 U/L (ref 38–126)
Bilirubin, Direct: 0.2 mg/dL (ref 0.0–0.2)
Indirect Bilirubin: 0.8 mg/dL (ref 0.3–0.9)
Total Bilirubin: 1 mg/dL (ref 0.3–1.2)
Total Protein: 6.8 g/dL (ref 6.5–8.1)

## 2019-06-13 LAB — POCT ACTIVATED CLOTTING TIME
Activated Clotting Time: 268 seconds
Activated Clotting Time: 230 seconds

## 2019-06-13 LAB — T4, FREE: Free T4: 0.72 ng/dL (ref 0.61–1.12)

## 2019-06-13 SURGERY — LEFT HEART CATH AND CORONARY ANGIOGRAPHY
Anesthesia: LOCAL

## 2019-06-13 MED ORDER — SODIUM CHLORIDE 0.9 % IV SOLN: 250.0000 mL | INTRAVENOUS | Status: DC | PRN

## 2019-06-13 MED ORDER — FENTANYL CITRATE (PF) 100 MCG/2ML IJ SOLN
INTRAMUSCULAR | Status: AC
  Filled 2019-06-13: qty 2

## 2019-06-13 MED ORDER — FAMOTIDINE IN NACL 20-0.9 MG/50ML-% IV SOLN
INTRAVENOUS | Status: AC | PRN
  Administered 2019-06-13: 20 mg via INTRAVENOUS

## 2019-06-13 MED ORDER — FENTANYL CITRATE (PF) 100 MCG/2ML IJ SOLN
INTRAMUSCULAR | Status: DC | PRN
  Administered 2019-06-13: 50 ug via INTRAVENOUS

## 2019-06-13 MED ORDER — SODIUM CHLORIDE 0.9% FLUSH: 3.0000 mL | Freq: Two times a day (BID) | INTRAVENOUS | Status: DC

## 2019-06-13 MED ORDER — NITROGLYCERIN 0.4 MG SL SUBL: 0.4000 mg | SUBLINGUAL_TABLET | SUBLINGUAL | 3 refills | Status: DC | PRN

## 2019-06-13 MED ORDER — HEPARIN SODIUM (PORCINE) 1000 UNIT/ML IJ SOLN
INTRAMUSCULAR | Status: AC
  Filled 2019-06-13: qty 1

## 2019-06-13 MED ORDER — CLOPIDOGREL BISULFATE 75 MG PO TABS: 75.0000 mg | ORAL_TABLET | Freq: Every day | ORAL | Status: DC

## 2019-06-13 MED ORDER — AMOXICILLIN 500 MG PO TABS: 2000.0000 mg | ORAL_TABLET | ORAL | Status: DC

## 2019-06-13 MED ORDER — LABETALOL HCL 5 MG/ML IV SOLN: 10.0000 mg | INTRAVENOUS | Status: AC | PRN

## 2019-06-13 MED ORDER — CLOPIDOGREL BISULFATE 300 MG PO TABS
ORAL_TABLET | ORAL | Status: AC
  Filled 2019-06-13: qty 1

## 2019-06-13 MED ORDER — ROSUVASTATIN CALCIUM 5 MG PO TABS: 10.0000 mg | ORAL_TABLET | ORAL | Status: DC

## 2019-06-13 MED ORDER — HYDRALAZINE HCL 20 MG/ML IJ SOLN: 10.0000 mg | INTRAMUSCULAR | Status: AC | PRN

## 2019-06-13 MED ORDER — ROSUVASTATIN CALCIUM 20 MG PO TABS: 20.0000 mg | ORAL_TABLET | Freq: Every day | ORAL | 6 refills | Status: DC

## 2019-06-13 MED ORDER — CLOPIDOGREL BISULFATE 75 MG PO TABS: 75.0000 mg | ORAL_TABLET | Freq: Every day | ORAL | 11 refills | Status: DC

## 2019-06-13 MED ORDER — MIDAZOLAM HCL 2 MG/2ML IJ SOLN
INTRAMUSCULAR | Status: AC
  Filled 2019-06-13: qty 2

## 2019-06-13 MED ORDER — VERAPAMIL HCL 2.5 MG/ML IV SOLN
INTRAVENOUS | Status: AC
  Filled 2019-06-13: qty 2

## 2019-06-13 MED ORDER — ONDANSETRON HCL 4 MG/2ML IJ SOLN: 4.0000 mg | Freq: Four times a day (QID) | INTRAMUSCULAR | Status: DC | PRN

## 2019-06-13 MED ORDER — SODIUM CHLORIDE 0.9% FLUSH: 3.0000 mL | INTRAVENOUS | Status: DC | PRN

## 2019-06-13 MED ORDER — HEPARIN (PORCINE) IN NACL 1000-0.9 UT/500ML-% IV SOLN
INTRAVENOUS | Status: AC
  Filled 2019-06-13: qty 1000

## 2019-06-13 MED ORDER — LORATADINE 10 MG PO TABS: 10.0000 mg | ORAL_TABLET | Freq: Every day | ORAL | Status: DC | PRN

## 2019-06-13 MED ORDER — HEPARIN (PORCINE) IN NACL 1000-0.9 UT/500ML-% IV SOLN
INTRAVENOUS | Status: DC | PRN
  Administered 2019-06-13: 500 mL

## 2019-06-13 MED ORDER — OMEGA-3-ACID ETHYL ESTERS 1 G PO CAPS: 2.0000 g | ORAL_CAPSULE | Freq: Two times a day (BID) | ORAL | Status: DC

## 2019-06-13 MED ORDER — IOHEXOL 350 MG/ML SOLN
INTRAVENOUS | Status: DC | PRN
  Administered 2019-06-13: 130 mL via INTRA_ARTERIAL

## 2019-06-13 MED ORDER — MIDAZOLAM HCL 2 MG/2ML IJ SOLN
INTRAMUSCULAR | Status: DC | PRN
  Administered 2019-06-13: 2 mg via INTRAVENOUS

## 2019-06-13 MED ORDER — FAMOTIDINE IN NACL 20-0.9 MG/50ML-% IV SOLN
INTRAVENOUS | Status: AC
  Filled 2019-06-13: qty 50

## 2019-06-13 MED ORDER — LIDOCAINE HCL (PF) 1 % IJ SOLN
INTRAMUSCULAR | Status: AC
  Filled 2019-06-13: qty 30

## 2019-06-13 MED ORDER — ASPIRIN 81 MG PO CHEW: 81.0000 mg | CHEWABLE_TABLET | ORAL | Status: DC

## 2019-06-13 MED ORDER — SODIUM CHLORIDE 0.9 % WEIGHT BASED INFUSION: 1.0000 mL/kg/h | INTRAVENOUS | Status: DC

## 2019-06-13 MED ORDER — SODIUM CHLORIDE 0.9 % WEIGHT BASED INFUSION
3.0000 mL/kg/h | INTRAVENOUS | Status: AC
  Administered 2019-06-13: 3 mL/kg/h via INTRAVENOUS

## 2019-06-13 MED ORDER — FLUTICASONE PROPIONATE 50 MCG/ACT NA SUSP: 1.0000 | Freq: Every day | NASAL | Status: DC

## 2019-06-13 MED ORDER — LISINOPRIL 10 MG PO TABS: 10.0000 mg | ORAL_TABLET | Freq: Every day | ORAL | Status: DC

## 2019-06-13 MED ORDER — LIDOCAINE HCL (PF) 1 % IJ SOLN
INTRAMUSCULAR | Status: DC | PRN
  Administered 2019-06-13: 3 mL

## 2019-06-13 MED ORDER — VERAPAMIL HCL 2.5 MG/ML IV SOLN
INTRAVENOUS | Status: DC | PRN
  Administered 2019-06-13: 10 mL via INTRA_ARTERIAL

## 2019-06-13 MED ORDER — CLOPIDOGREL BISULFATE 300 MG PO TABS
ORAL_TABLET | ORAL | Status: DC | PRN
  Administered 2019-06-13: 600 mg via ORAL

## 2019-06-13 MED ORDER — HEPARIN SODIUM (PORCINE) 1000 UNIT/ML IJ SOLN
INTRAMUSCULAR | Status: DC | PRN
  Administered 2019-06-13: 5000 [IU] via INTRAVENOUS
  Administered 2019-06-13: 3000 [IU] via INTRAVENOUS
  Administered 2019-06-13: 2000 [IU] via INTRAVENOUS
  Administered 2019-06-13: 5000 [IU] via INTRAVENOUS

## 2019-06-13 MED ORDER — SODIUM CHLORIDE 0.9 % IV SOLN: INTRAVENOUS | Status: AC

## 2019-06-13 MED ORDER — ACETAMINOPHEN 325 MG PO TABS: 650.0000 mg | ORAL_TABLET | ORAL | Status: DC | PRN

## 2019-06-13 MED FILL — NITROGLYCERIN 0.4 MG TAB SL: 0.4 | 8 days supply | Qty: 25 | Fill #0

## 2019-06-13 MED FILL — CLOPIDOGREL 75 MG TABLET: 75 | 30 days supply | Qty: 30 | Fill #0

## 2019-06-13 MED FILL — ROSUVASTATIN CALCIUM 20 MG: 20 | 30 days supply | Qty: 30 | Fill #0

## 2019-06-13 SURGICAL SUPPLY — 17 items
BALLN SAPPHIRE 2.0X12 (BALLOONS) ×2
BALLN SAPPHIRE ~~LOC~~ 2.75X8 (BALLOONS) ×2 IMPLANT
BALLOON SAPPHIRE 2.0X12 (BALLOONS) ×1 IMPLANT
CATH OPTITORQUE TIG 4.0 5F (CATHETERS) ×2 IMPLANT
CATH VISTA GUIDE 6FR JR4 (CATHETERS) ×2 IMPLANT
DEVICE RAD COMP TR BAND LRG (VASCULAR PRODUCTS) ×2 IMPLANT
GLIDESHEATH SLEND A-KIT 6F 22G (SHEATH) ×2 IMPLANT
GUIDEWIRE INQWIRE 1.5J.035X260 (WIRE) ×1 IMPLANT
INQWIRE 1.5J .035X260CM (WIRE) ×2
KIT ENCORE 26 ADVANTAGE (KITS) ×2 IMPLANT
KIT HEART LEFT (KITS) ×2 IMPLANT
PACK CARDIAC CATHETERIZATION (CUSTOM PROCEDURE TRAY) ×2 IMPLANT
SHEATH PROBE COVER 6X72 (BAG) ×2 IMPLANT
STENT RESOLUTE ONYX 2.5X12 (Permanent Stent) ×2 IMPLANT
TRANSDUCER W/STOPCOCK (MISCELLANEOUS) ×2 IMPLANT
TUBING CIL FLEX 10 FLL-RA (TUBING) ×2 IMPLANT
WIRE ASAHI PROWATER 180CM (WIRE) ×2 IMPLANT

## 2019-06-13 NOTE — Interval H&P Note (Signed)
History and Physical Interval Note:  06/13/2019 11:13 AM  Steven Kennedy  has presented today for surgery, with the diagnosis of synocopy - abnormal nuc.  The various methods of treatment have been discussed with the patient and family. After consideration of risks, benefits and other options for treatment, the patient has consented to  Procedure(s): LEFT HEART CATH AND CORONARY ANGIOGRAPHY (N/A)  PERCUTANEOUS CORONARY INTERVENTION   as a surgical intervention.  The patient's history has been reviewed, patient examined, no change in status, stable for surgery.  I have reviewed the patient's chart and labs.  Questions were answered to the patient's satisfaction.     Cath Lab Visit (complete for each Cath Lab visit)  Clinical Evaluation Leading to the Procedure:   ACS: No.  Non-ACS:    Anginal Classification: No Symptoms - Sx was Syncope  Anti-ischemic medical therapy: Minimal Therapy (1 class of medications)  Non-Invasive Test Results: Intermediate-risk stress test findings: cardiac mortality 1-3%/year  Prior CABG: No previous CABG    [name]

## 2019-06-13 NOTE — Telephone Encounter (Signed)
FYI patient will need TOC call for same day PCI discharge.Has f/u 12/10 with Laurann Montana. Thanks! Dayna Dunn PA-C

## 2019-06-13 NOTE — Progress Notes (Signed)
sunquest down and unable to start program to create lab labels.  Attempted on multiple computers on unit.  Will use patient label

## 2019-06-13 NOTE — Progress Notes (Signed)
[id number] Education completed with pt who voiced understanding. Stressed importance of plavix with stent. Reviewed NTG use, walking for ex., heart healthy food choices, CRP 2.  Referred to [facility] CRP 2.  chronic

## 2019-06-13 NOTE — Discharge Instructions (Signed)
Radial Site Care ° °This sheet gives you information about how to care for yourself after your procedure. Your health care provider may also give you more specific instructions. If you have problems or questions, contact your health care provider. °What can I expect after the procedure? °After the procedure, it is common to have: °· Bruising and tenderness at the catheter insertion area. °Follow these instructions at home: °Medicines °· Take over-the-counter and prescription medicines only as told by your health care provider. °Insertion site care °· Follow instructions from your health care provider about how to take care of your insertion site. Make sure you: °? Wash your hands with soap and water before you change your bandage (dressing). If soap and water are not available, use hand sanitizer. °? Change your dressing as told by your health care provider. °? Leave stitches (sutures), skin glue, or adhesive strips in place. These skin closures may need to stay in place for 2 weeks or longer. If adhesive strip edges start to loosen and curl up, you may trim the loose edges. Do not remove adhesive strips completely unless your health care provider tells you to do that. °· Check your insertion site every day for signs of infection. Check for: °? Redness, swelling, or pain. °? Fluid or blood. °? Pus or a bad smell. °? Warmth. °· Do not take baths, swim, or use a hot tub until your health care provider approves. °· You may shower 24-48 hours after the procedure, or as directed by your health care provider. °? Remove the dressing and gently wash the site with plain soap and water. °? Pat the area dry with a clean towel. °? Do not rub the site. That could cause bleeding. °· Do not apply powder or lotion to the site. °Activity ° °· For 24 hours after the procedure, or as directed by your health care provider: °? Do not flex or bend the affected arm. °? Do not push or pull heavy objects with the affected arm. °? Do not  drive yourself home from the hospital or clinic. You may drive 24 hours after the procedure unless your health care provider tells you not to. °? Do not operate machinery or power tools. °· Do not lift anything that is heavier than 10 lb (4.5 kg), or the limit that you are told, until your health care provider says that it is safe. °· Ask your health care provider when it is okay to: °? Return to work or school. °? Resume usual physical activities or sports. °? Resume sexual activity. °General instructions °· If the catheter site starts to bleed, raise your arm and put firm pressure on the site. If the bleeding does not stop, get help right away. This is a medical emergency. °· If you went home on the same day as your procedure, a responsible adult should be with you for the first 24 hours after you arrive home. °· Keep all follow-up visits as told by your health care provider. This is important. °Contact a health care provider if: °· You have a fever. °· You have redness, swelling, or yellow drainage around your insertion site. °Get help right away if: °· You have unusual pain at the radial site. °· The catheter insertion area swells very fast. °· The insertion area is bleeding, and the bleeding does not stop when you hold steady pressure on the area. °· Your arm or hand becomes pale, cool, tingly, or numb. °These symptoms may represent a serious problem   that is an emergency. Do not wait to see if the symptoms will go away. Get medical help right away. Call your local emergency services (911 in the [place] Do not drive yourself to the hospital. °Summary °· After the procedure, it is common to have bruising and tenderness at the site. °· Follow instructions from your health care provider about how to take care of your radial site wound. Check the wound every day for signs of infection. °· Do not lift anything that is heavier than 10 lb (4.5 kg), or the limit that you are told, until your health care provider says  that it is safe. °This information is not intended to replace advice given to you by your health care provider. Make sure you discuss any questions you have with your health care provider. °Document Released: 07/29/2010 Document Revised: 08/01/2017 Document Reviewed: 08/01/2017 °Elsevier Patient Education © 2020 [employer] ° °

## 2019-06-13 NOTE — Discharge Summary (Addendum)
Discharge Summary    Patient ID: [name] MRN: [id number]; DOB: 1951-01-30  Admit date: 06/13/2019 Discharge date: 06/13/2019  Primary Care Provider: Maryella Shivers, MD  Primary Cardiologist: Shirlee More, MD Primary Electrophysiologist:  None   Discharge Diagnoses    Principal Problem:   Abnormal nuclear stress test Active Problems:   Syncope and collapse   CAD in native artery   Essential hypertension   Hyperlipidemia   Sinus bradycardia   Diagnostic Studies/Procedures    Cardiac Cath 06/13/19 Conclusion    CULPRIT LESION: Dist RCA lesion is 85% stenosed.  A drug-eluting stent was successfully placed using a STENT RESOLUTE ONYX 2.5X12.  Post intervention, there is a 0% residual stenosis.  -------------------------  Prox RCA lesion is 60% stenosed. Mid RCA lesion is 45% stenosed.  Prox Cx lesion is 65% stenosed.  Ramus-1 lesion is 50% stenosed. Ramus-2 lesion is 55% stenosed. Lat Ramus lesion is 50% stenosed.  Ost LAD to Prox LAD lesion is 20% stenosed.  Dist LAD lesion is 90% stenosed.  LV end diastolic pressure is normal.  There is no aortic valve stenosis.   SUMMARY  Severe single-vessel disease with focal 85% distal RCA just prior to a bifurcating PDA (correlates with ischemic distribution on stress test)-> successfully treated with Resolute Onyx DES 2.5 mm x 12 mm (tapered from 2.9 to 2.6 mm)  Residual moderate disease in the proximal and mid RCA, proximal LCx and proximal RI.  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  TR band removal per protocol  Anticipate same-day discharge and follow-up with Dr. Bettina Gavia  Clopidogrel 75 mg daily initiated.  No beta-blocker based on significant bradycardia in the Cath Lab (heart rates in the 40s)  Patient's wife was updated via telephone.  [name], MD      _____________   History of Present Illness     Steven Kennedy is a 68  y.o. male with HTN, dyslipidemia, history of exertional syncope who presented to [facility] today for planned cath. He has a history of exertional syncope for which he has been evaluated by Dr. Bettina Gavia. He was recommended not to drive for 6 months. Per his note 06/04/19, "I did receive the 30-day event monitor. Rhythm was sinus throughout there was no complex arrhythmia. There is a strip on 03/26/2019 which shows a nonconducted P wave and a pauses lasting 2-1/2 seconds the QRS duration is not prolonged but it is not typical Darden Amber. There are other instances of atrial premature beats and it may be a nonconducted APC. There are no episodes of sinus node exit block." EP evaluation was arranged and is pending. He underwent nuclear stress test 05/29/19 which showed small defect of mild severity present in the basal inferior location consistent with reversible ischemia, EF 55%. Cardiac catheterization was recommended.   Hospital Course     He underwent this procedure today with findings as above, ultimately treated with DES to dRCA with residual moderate disease in the proximal and mid RCA, proximal LCx and proximal RI. He was started on Plavix 75mg  daily in addition to ASA. He should not be on beta-blocker based on bradycardia in the Cath Lab (heart rates in the 40s). His Crestor was increased from 10mg  weekly to 20mg  daily. I have requested baseline LFTs (due to statin increase) and thyroid function (so this information is available to EP) prior to the patient's dc. If the patient is tolerating statin at time of  follow-up appointment, would consider rechecking liver function/lipid panel in 6-8 weeks. The patient reports he was only on Crestor once weekly as he was told previously his cholesterol was not that bad. The patient was observed post cath and did well. He was felt to be a candidate for same day PCI. Dr. Ellyn Hack has seen and examined the patient today and feels he is stable for discharge. As  above, the patient was previously advised not to drive for 6 months due to syncope. Dr. Ellyn Hack recommended he discuss clearance for returning to work when he speaks to Dr. Curt Bears next week. (He works 1 day a week at a [employer] and does not do any heavy lifting.) We also arranged f/u in the [facility] (no [place] availability) for post PCI appt on 06/19/19. He was made aware of location. He also appears to have f/u in January with Dr. Bettina Gavia. It can be decided at his post-PCI appointment if that January visit is still needed. NSAID precautions relayed regarding Mobic. Post-cath instructions outlined below.   Did the patient have an acute coronary syndrome (MI, NSTEMI, STEMI, etc) this admission?:  No                               Did the patient have a percutaneous coronary intervention (stent / angioplasty)?:  Yes.     Cath/PCI Registry Performance & Quality Measures: 1. Aspirin prescribed? - Yes 2. ADP Receptor Inhibitor (Plavix/Clopidogrel, Brilinta/Ticagrelor or Effient/Prasugrel) prescribed (includes medically managed patients)? - Yes 3. High Intensity Statin (Lipitor 40-80mg  or Crestor 20-40mg ) prescribed? - Yes 4. For EF <40%, was ACEI/ARB prescribed? - Not Applicable (EF >/= [id number]) 5. For EF <40%, Aldosterone Antagonist (Spironolactone or Eplerenone) prescribed? - Not Applicable (EF >/= [id number]) 6. Cardiac Rehab Phase II ordered (Included Medically managed Patients)? - Yes   _____________  Discharge Vitals BP (!) 167/86    Pulse 65    Temp 97.7 F (36.5 C) (Skin)    Resp 15    Ht 6\' 1"  (1.854 m)    Wt 100.7 kg    SpO2 97%    BMI 29.29 kg/m  General: Well developed, well nourished, in no acute distress. Head: Normocephalic, atraumatic, sclera non-icteric,  nares are without discharge. Neck: Negative for carotid bruits. JVP not elevated. Lungs: Clear bilaterally to auscultation without wheezes, rales, or rhonchi. Breathing is unlabored. Heart: RRR S1 S2 without murmurs, rubs, or gallops.   Abdomen: Soft, non-tender, non-distended with normoactive bowel sounds. No rebound/guarding. Extremities: No clubbing or cyanosis. No edema. Distal pedal pulses are 2+ and equal bilaterally. Right radial cath site without hematoma or ecchymosis; good pulse. Neuro: Alert and oriented X 3. Moves all extremities spontaneously. Psych:  Responds to questions appropriately with a normal affect.  Labs & Radiologic Studies    CBC No results for input(s): WBC, NEUTROABS, HGB, HCT, MCV, PLT in the last 72 hours. Basic Metabolic Panel No results for input(s): NA, K, CL, CO2, GLUCOSE, BUN, CREATININE, CALCIUM, MG, PHOS in the last 72 hours. Liver Function Tests No results for input(s): AST, ALT, ALKPHOS, BILITOT, PROT, ALBUMIN in the last 72 hours. No results for input(s): LIPASE, AMYLASE in the last 72 hours. High Sensitivity Troponin:   No results for input(s): TROPONINIHS in the last 720 hours.  BNP Invalid input(s): POCBNP D-Dimer No results for input(s): DDIMER in the last 72 hours. Hemoglobin A1C No results for input(s): HGBA1C in the last 72 hours.  Fasting Lipid Panel No results for input(s): CHOL, HDL, LDLCALC, TRIG, CHOLHDL, LDLDIRECT in the last 72 hours. Thyroid Function Tests No results for input(s): TSH, T4TOTAL, T3FREE, THYROIDAB in the last 72 hours.  Invalid input(s): FREET3 _____________  No results found. Disposition   Pt is being discharged home today in good condition.  Follow-up Plans & Appointments    Follow-up Information    [facility] appointments Follow up.   Why: See appointments listed below - you will have a virtual office visit on 06/16/19 with Dr. Curt Bears. Your post-procedure follow-up will be with Laurann Montana, one of our nurse practitioners on 06/19/19 at 11:15am in the [place] location.          Discharge Instructions    Amb Referral to Cardiac Rehabilitation   Complete by: As directed    Referring to [facility] CRP 2   Diagnosis: Coronary  Stents   After initial evaluation and assessments completed: Virtual Based Care may be provided alone or in conjunction with Phase 2 Cardiac Rehab based on patient barriers.: Yes   Diet - low sodium heart healthy   Complete by: As directed    Discharge instructions   Complete by: As directed    You were started on a blood thinner called Plavix/clopidogrel to take in addition to your aspirin. If you notice any bleeding such as blood in stool, black tarry stools, blood in urine, nosebleeds or any other unusual bleeding, call your doctor immediately. It is not normal to have this kind of bleeding while on a blood thinner and usually indicates there is an underlying problem with one of your body systems that needs to be checked out.   Your rosuvastatin dose was increased.  We also sent in a prescription for nitroglycerin as-needed.  Patients taking blood thinners should generally limit medicines like ibuprofen, Advil, Meloxicam, Mobic, Motrin, naproxen, and Aleve due to risk of stomach bleeding. You may take Tylenol as directed or talk to your primary doctor about alternatives.   Increase activity slowly   Complete by: As directed    No driving until cleared by your cardiologist given your prior history of passing out (Dr. Bettina Gavia recently advised that you not drive for 6 months). Dr. Ellyn Hack would recommend you discuss clearance to return to work with Dr. Curt Bears (the electrophysiologist) when you speak with him for your appointment next week. No lifting over 5 lbs for 1 week. No sexual activity for 1 week. Keep procedure site clean & dry. If you notice increased pain, swelling, bleeding or pus, call/return!  You may shower, but no soaking baths/hot tubs/pools for 1 week.      Discharge Medications   Allergies as of 06/13/2019   No Known Allergies     Medication List    TAKE these medications   amoxicillin 500 MG tablet Commonly known as: AMOXIL Take 2,000 mg by mouth See admin instructions.  Before Dental procedures   aspirin EC 81 MG tablet Take 81 mg by mouth daily.   clopidogrel 75 MG tablet Commonly known as: PLAVIX Take 1 tablet (75 mg total) by mouth daily with breakfast. Start taking on: June 14, 2019   FISH OIL PO Take 1 tablet by mouth daily.   fluticasone 50 MCG/ACT nasal spray Commonly known as: FLONASE Place 1 spray into both nostrils daily.   lisinopril 10 MG tablet Commonly known as: ZESTRIL Take 10 mg by mouth daily.   loratadine 10 MG tablet Commonly known as: CLARITIN Take 10 mg by mouth  daily as needed for allergies.   MAGNESIUM 27 PO Take 2 tablets by mouth daily.   meloxicam 7.5 MG tablet Commonly known as: MOBIC Take 15 mg by mouth daily. Notes to patient: Patients taking blood thinners should generally limit medicines like ibuprofen, Advil, Meloxicam, Mobic, Motrin, naproxen, and Aleve due to risk of stomach bleeding. You may take Tylenol as directed or talk to your primary doctor about alternatives.   multivitamin tablet Take 1 tablet by mouth daily.   nitroGLYCERIN 0.4 MG SL tablet Commonly known as: Nitrostat Place 1 tablet (0.4 mg total) under the tongue every 5 (five) minutes as needed for chest pain (up to 3 doses).   rosuvastatin 20 MG tablet Commonly known as: CRESTOR Take 1 tablet (20 mg total) by mouth daily. What changed:   medication strength  how much to take  when to take this  additional instructions   VITAMIN B12 PO Take 1 tablet by mouth daily.   vitamin C 500 MG tablet Commonly known as: ASCORBIC ACID Take 1,500 mg by mouth daily.   zinc gluconate 50 MG tablet Take 100 mg by mouth daily.          Outstanding Labs/Studies   Pending LFTs, TSH, and free T4  Duration of Discharge Encounter   Greater than 30 minutes including physician time.  Signed, Charlie Pitter, PA-C 06/13/2019, 6:46 PM

## 2019-06-16 ENCOUNTER — Other Ambulatory Visit: Payer: Self-pay

## 2019-06-16 ENCOUNTER — Telehealth (INDEPENDENT_AMBULATORY_CARE_PROVIDER_SITE_OTHER): Payer: PPO | Admitting: Cardiology

## 2019-06-16 ENCOUNTER — Encounter (HOSPITAL_COMMUNITY): Payer: Self-pay | Admitting: Cardiology

## 2019-06-16 DIAGNOSIS — R001 Bradycardia, unspecified: Secondary | ICD-10-CM | POA: Diagnosis not present

## 2019-06-16 MED ORDER — LISINOPRIL 20 MG PO TABS: 20.0000 mg | ORAL_TABLET | Freq: Every day | ORAL | 3 refills | Status: DC

## 2019-06-16 NOTE — Patient Instructions (Signed)
Medication Instructions:  Your physician has recommended you make the following change in your medication:  1. INCREASE Lisinopril to 20 mg once daily  * If you need a refill on your cardiac medications before your next appointment, please call your pharmacy.   Labwork: None ordered  Testing/Procedures: None ordered  Follow-Up: No follow up is needed at this time with Dr. Curt Bears.  He will see you on an as needed basis.  Thank you for choosing [facility]!!   Trinidad Curet, RN [phone]

## 2019-06-16 NOTE — Progress Notes (Signed)
Electrophysiology TeleHealth Note   Due to national recommendations of social distancing due to COVID 19, an audio/video telehealth visit is felt to be most appropriate for this patient at this time.  See Epic message for the patient's consent to telehealth for [facility].   Date:  06/16/2019   ID:  Steven Kennedy, DOB February 11, 1951, MRN [id number]  Location: patient's home  Provider location: [address] [place]  Evaluation Performed: Follow-up visit  PCP:  Maryella Shivers, MD  Cardiologist:  Shirlee More, MD + Electrophysiologist:  Dr Curt Bears  Chief Complaint: Syncope  History of Present Illness:    Steven Kennedy is a 68 y.o. male who presents via audio/video conferencing for a telehealth visit today.  Since last being seen in our clinic, the patient reports doing very well.  Today, he denies symptoms of palpitations, chest pain, shortness of breath,  lower extremity edema, dizziness, presyncope, or syncope.  The patient is otherwise without complaint today.  The patient denies symptoms of fevers, chills, cough, or new SOB worrisome for COVID 19.  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  While working, he felt warm and sweaty.  His wife came over and brought him in milkshake.  He sat down took a drink for and got weak.  He was diaphoretic mild to the ground.  He became rated 3-year-old in the back of his head with an extended nausea for about 1 minute.  Did not have any bowel or urinary incontinence.  He did have a prolonged.  Amnesia episode.  He has never had syncope or diagnosed with a seizure disorder he had an EEG which was normal.  He wore an event monitor that showed nocturnal bradycardia but otherwise or abnormality.  He had a Myoview which showed an intermediate risk study with a left heart catheterization showing coronary artery disease and he is now status post RCA stent.  Since that episode, he has had no further issues.  He says that they  episode occurred on a very hot day.  He feels that he was somewhat dehydrated.  He says that he did not actually have a syncopal episode, that he just laid back as he felt poorly.  Today, denies symptoms of palpitations, chest pain, shortness of breath, orthopnea, PND, lower extremity edema, claudication, dizziness, presyncope, syncope, bleeding, or neurologic sequela. The patient is tolerating medications without difficulties.    Past Medical History:  Diagnosis Date  . CAD (coronary artery disease)    a. abnormal nuc - cath 06/2019 s/p DES to dRCA with residual moderate disease in the proximal and mid RCA, proximal LCx and proximal RI. EF 55%.  . Dyslipidemia   . Hypertension   . Sinus bradycardia   . Skin cancer 2007  . Syncope and collapse     Past Surgical History:  Procedure Laterality Date  . CORONARY STENT INTERVENTION N/A 06/13/2019   Procedure: CORONARY STENT INTERVENTION;  Surgeon: Leonie Man, MD;  Location: [place] [facility];  Service: Cardiovascular;  Laterality: N/A;  . LEFT HEART CATH AND CORONARY ANGIOGRAPHY N/A 06/13/2019   Procedure: LEFT HEART CATH AND CORONARY ANGIOGRAPHY;  Surgeon: Leonie Man, MD;  Location: [place] [facility];  Service: Cardiovascular;  Laterality: N/A;  . SKIN CANCER EXCISION    . TOTAL HIP ARTHROPLASTY      Current Outpatient Medications  Medication Sig Dispense Refill  . amoxicillin (AMOXIL) 500 MG tablet Take 2,000 mg by mouth  See admin instructions. Before Dental procedures    . aspirin EC 81 MG tablet Take 81 mg by mouth daily.    . clopidogrel (PLAVIX) 75 MG tablet Take 1 tablet (75 mg total) by mouth daily with breakfast. 30 tablet 11  . Cyanocobalamin (VITAMIN B12 PO) Take 1 tablet by mouth daily.    . fluticasone (FLONASE) 50 MCG/ACT nasal spray Place 1 spray into both nostrils daily.    Marland Kitchen loratadine (CLARITIN) 10 MG tablet Take 10 mg by mouth daily as needed for allergies.    . Magnesium Gluconate (MAGNESIUM 27 PO) Take 2  tablets by mouth daily.    . meloxicam (MOBIC) 7.5 MG tablet Take 15 mg by mouth daily.     . Multiple Vitamin (MULTIVITAMIN) tablet Take 1 tablet by mouth daily.    . nitroGLYCERIN (NITROSTAT) 0.4 MG SL tablet Place 1 tablet (0.4 mg total) under the tongue every 5 (five) minutes as needed for chest pain (up to 3 doses). 25 tablet 3  . Omega-3 Fatty Acids (FISH OIL PO) Take 1 tablet by mouth daily.     . rosuvastatin (CRESTOR) 20 MG tablet Take 1 tablet (20 mg total) by mouth daily. 30 tablet 6  . vitamin C (ASCORBIC ACID) 500 MG tablet Take 1,500 mg by mouth daily.    Marland Kitchen zinc gluconate 50 MG tablet Take 100 mg by mouth daily.    Marland Kitchen lisinopril (ZESTRIL) 20 MG tablet Take 1 tablet (20 mg total) by mouth daily. 90 tablet 3   No current facility-administered medications for this visit.     Allergies:   Patient has no known allergies.   Social History:  The patient  reports that he quit smoking about 38 years ago. His smoking use included cigarettes. He has a 12.50 pack-year smoking history. He quit smokeless tobacco use about 38 years ago.  His smokeless tobacco use included chew. He reports previous alcohol use. He reports previous drug use.   Family History:  The patient's  family history includes Dementia in his paternal grandmother; Heart attack in his father, mother, and paternal grandfather; Hyperlipidemia in his father, mother, sister, and sister; Hypertension in his father, mother, sister, and sister; Stroke in his father.   ROS:  Please see the history of present illness.   All other systems are personally reviewed and negative.    Exam:    Vital Signs:  BP (!) 153/92   Pulse (!) 54   no acute distress, no shortness of breath.  Labs/Other Tests and Data Reviewed:    Recent Labs: 06/04/2019: BUN 15; Creatinine, Ser 0.96; Hemoglobin 15.5; Platelets 196; Potassium 5.0; Sodium 140 06/13/2019: ALT 24; TSH 2.628   Wt Readings from Last 3 Encounters:  06/13/19 222 lb (100.7 kg)   06/04/19 227 lb 6.4 oz (103.1 kg)  05/29/19 222 lb (100.7 kg)     Other studies personally reviewed: Additional studies/ records that were reviewed today include: She is positive for LHC on we Jacklyne Baik 06/13/19. Review of the above records today demonstrates:    CULPRIT LESION: Dist RCA lesion is 85% stenosed.  A drug-eluting stent was successfully placed using a STENT RESOLUTE ONYX 2.5X12.  Post intervention, there is a 0% residual stenosis.  -------------------------  Prox RCA lesion is 60% stenosed. Mid RCA lesion is 45% stenosed.  Prox Cx lesion is 65% stenosed.  Ramus-1 lesion is 50% stenosed. Ramus-2 lesion is 55% stenosed. Lat Ramus lesion is 50% stenosed.  Ost LAD to Prox LAD lesion is  20% stenosed.  Dist LAD lesion is 90% stenosed.  LV end diastolic pressure is normal.  There is no aortic valve stenosis.   ASSESSMENT & PLAN:    1.  Near syncope: I called his primary physician's office with a recordings from his cardiac monitor.  He showed no evidence of heart block.  He does have bradycardia which is nocturnal.  I do think that he has had issues with dehydration during the episode.  I told him that if he has any further episodes, to call us back and we Darrien Laakso implant a loop monitor.  2.  Coronary artery disease: Status post RCA stent.  No current chest pain.  3.  Hypertension: Blood pressure is significantly elevated today.  Both him and his wife state that it has been elevated for quite some time.  He is currently on lisinopril.  We Jaqlyn Gruenhagen increase his dose today.  He has follow-up in 3 days with cardiology for further blood pressure monitoring.  COVID 19 screen The patient denies symptoms of COVID 19 at this time.  The importance of social distancing was discussed today.  Follow-up: As needed  Current medicines are reviewed at length with the patient today.   The patient does not have concerns regarding his medicines.  The following changes were made today:  none   Labs/ tests ordered today include:  No orders of the defined types were placed in this encounter.    Patient Risk:  after full review of this patients clinical status, I feel that they are at moderate risk at this time.  Today, I have spent 15 minutes with the patient with telehealth technology discussing hypertension, near-syncope.    Signed, Cortni Tays Meredith Leeds, MD  06/16/2019 10:42 AM     [facility] [address] [place] [address] [phone] (office) [phone] (fax) Rx

## 2019-06-16 NOTE — Telephone Encounter (Signed)
Please complete TOC call as patient is scheduled to see Urban Gibson, NP on 06/19/2019. Thanks!

## 2019-06-17 ENCOUNTER — Telehealth: Payer: Self-pay | Admitting: Family

## 2019-06-17 ENCOUNTER — Telehealth: Payer: Self-pay

## 2019-06-17 NOTE — Telephone Encounter (Signed)
Pt. Would like to know if his wife can come back with him on his appt. Date. He says he just had a stint put in and she has to drive for him. He is requesting a call back

## 2019-06-17 NOTE — Telephone Encounter (Signed)
Left voice message with call back number to initiate Transfer of Care.

## 2019-06-17 NOTE — Telephone Encounter (Signed)
Patient contacted regarding discharge from Cath Lab on 06/13/19.  Patient understands to follow up with provider Laurann Montana on 06/19/19 at 52 at [facility]. Patient understands discharge instructions: Yes Patient understands medications and regiment? Yes Patient understands to bring all medications to this visit? Yes  No signs of infection, redness or swelling to right wrist arterial access site. Bruising present but has not changed since procedure.

## 2019-06-18 NOTE — Telephone Encounter (Signed)
Hi Steven Kennedy,  Please call Steven Kennedy today (Wednesday) as his appointment his tomorrow. Unless there is a cognitive or physical limitation unfortunately we cannot allow his wife in the appointment due to our continued restricted visitor policy. I am happy to call her during the visit and to make sure all directions are clear on the after-visit-summary.   Thanks, Loel Dubonnet, NP

## 2019-06-18 NOTE — Telephone Encounter (Signed)
Phoned and spoke patient's wife Ava (DPR). Informed that due to covid restrictions, we are not allowing visitors in with patients at appointments.  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 Informed patient that we will likely have her come into visit with patient in that case but to check at desk when checking patient in for appt.

## 2019-06-19 ENCOUNTER — Other Ambulatory Visit: Payer: Self-pay

## 2019-06-19 ENCOUNTER — Encounter: Payer: Self-pay | Admitting: Family

## 2019-06-19 ENCOUNTER — Ambulatory Visit (INDEPENDENT_AMBULATORY_CARE_PROVIDER_SITE_OTHER): Payer: PPO | Admitting: Family

## 2019-06-19 VITALS — BP 120/90 | HR 52 | Ht 73.0 in | Wt 221.0 lb

## 2019-06-19 DIAGNOSIS — I10 Essential (primary) hypertension: Secondary | ICD-10-CM

## 2019-06-19 DIAGNOSIS — I2510 Atherosclerotic heart disease of native coronary artery without angina pectoris: Secondary | ICD-10-CM | POA: Diagnosis not present

## 2019-06-19 DIAGNOSIS — R55 Syncope and collapse: Secondary | ICD-10-CM

## 2019-06-19 DIAGNOSIS — E782 Mixed hyperlipidemia: Secondary | ICD-10-CM | POA: Diagnosis not present

## 2019-06-19 NOTE — Progress Notes (Signed)
Office Visit    Patient Name: Steven Kennedy Date of Encounter: 06/19/2019  Primary Care Provider:  Maryella Shivers, MD Primary Cardiologist:  Shirlee More, MD Electrophysiologist:  None   Chief Complaint    Steven Kennedy is a 68 y.o. male with a hx of CAD, HTN, DL D, history of exertional syncope presents today for follow-up after cardiac catheterization  Past Medical History    Past Medical History:  Diagnosis Date  . CAD (coronary artery disease)    a. abnormal nuc - cath 06/2019 s/p DES to dRCA with residual moderate disease in the proximal and mid RCA, proximal LCx and proximal RI. EF 55%.  . Dyslipidemia   . Hypertension   . Sinus bradycardia   . Skin cancer 2007  . Syncope and collapse    Past Surgical History:  Procedure Laterality Date  . CORONARY STENT INTERVENTION N/A 06/13/2019   Procedure: CORONARY STENT INTERVENTION;  Surgeon: Leonie Man, MD;  Location: [facility];  Service: Cardiovascular;  Laterality: N/A;  . LEFT HEART CATH AND CORONARY ANGIOGRAPHY N/A 06/13/2019   Procedure: LEFT HEART CATH AND CORONARY ANGIOGRAPHY;  Surgeon: Leonie Man, MD;  Location: [facility];  Service: Cardiovascular;  Laterality: N/A;  . SKIN CANCER EXCISION    . TOTAL HIP ARTHROPLASTY      Allergies  No Known Allergies  History of Present Illness    Steven Kennedy is a 68 y.o. male with a hx of CAD, HTN, DL D, history of exertional syncope last seen by Dr. Curt Bears 06/16/2019 18 by general cardiology during cardiac catheterization 06/13/2019.  Episode of exertional syncope while mowing outdoors. Referred to EP Dr. Curt Bears. He had a 30 day monitor by his PCP with reported findings of "SR with no complex arrhythmia, strip 03/26/2019 with nonconducted P waves and pauses lasting 2.5 seconds, QRS duration not prolonged, instances of APC may be nonconducted APC.  No episodes sinus node exit block" per Dr. Joya Gaskins note.  Nuclear stress test 05/30/2019  small defect mild severity and basal inferior location consistent with reversible ischemia EF 55%.  Cardiac catheterization 06/13/2019 with DES to the RCA and residual moderate disease in proximal and mid RCA, proximal LCx, proximal RI.  Started on Plavix 75 mg daily in addition aspirin.  No beta-blocker secondary to bradycardia heart rates 40s in the Cath Lab.  Crestor increased from 10 mg weekly to 20 mg daily.  He was referred to cardiac rehab in [place].  Seen by Dr. Curt Bears 06/16/19 reviewed cardiac monitor with no evidence of heart block. Episode felt to be due to dehydration. Instructed to call back for loop recorder if further episodes occur.   His wife is present for today's visit.  He reports feeling well since discharge.  He reports no chest pain, pressure, tightness.  He reports no SLB nor DOE.  We reviewed Findings in detail and the reasoning for not placing stents and vessels that are less than 75% blocked.  Discussed increased dose of Crestor.  He reports tolerating well.  His wife helps him to check his blood pressure and heart rate at home.  Tells me both of been well controlled.  His right radial cath site has not caused him any pain and has just mild bruising.  We discussed cardiac rehab and I strongly encouraged him to participate.  EKGs/Labs/Other Studies Reviewed:   The following studies were reviewed today: Cardiac Cath 06/13/19 Conclusion      CULPRIT  LESION: Dist RCA lesion is 85% stenosed.  A drug-eluting stent was successfully placed using a STENT RESOLUTE ONYX 2.5X12.  Post intervention, there is a 0% residual stenosis.  -------------------------  Prox RCA lesion is 60% stenosed. Mid RCA lesion is 45% stenosed.  Prox Cx lesion is 65% stenosed.  Ramus-1 lesion is 50% stenosed. Ramus-2 lesion is 55% stenosed. Lat Ramus lesion is 50% stenosed.  Ost LAD to Prox LAD lesion is 20% stenosed.  Dist LAD lesion is 90% stenosed.  LV end diastolic pressure is  normal.  There is no aortic valve stenosis.   SUMMARY  Severe single-vessel disease with focal 85% distal RCA just prior to a bifurcating PDA (correlates with ischemic distribution on stress test)-> successfully treated with Resolute Onyx DES 2.5 mm x 12 mm (tapered from 2.9 to 2.6 mm)  Residual moderate disease in the proximal and mid RCA, proximal LCx and proximal RI.  Normal LVEDP   RECOMMENDATIONS  Return to short stay nursing area for post cath/PCI recovery.  TR band removal per protocol  Anticipate same-day discharge and follow-up with Dr. Bettina Gavia  Clopidogrel 75 mg daily initiated.  No beta-blocker based on significant bradycardia in the Cath Lab (heart rates in the 40s)    EKG: No EKG today  Recent Labs: 06/04/2019: BUN 15; Creatinine, Ser 0.96; Hemoglobin 15.5; Platelets 196; Potassium 5.0; Sodium 140 06/13/2019: ALT 24; TSH 2.628  Recent Lipid Panel No results found for: CHOL, TRIG, HDL, CHOLHDL, VLDL, LDLCALC, LDLDIRECT  Home Medications   Current Meds  Medication Sig  . amoxicillin (AMOXIL) 500 MG tablet Take 2,000 mg by mouth See admin instructions. Before Dental procedures  . aspirin EC 81 MG tablet Take 81 mg by mouth daily.  . clopidogrel (PLAVIX) 75 MG tablet Take 1 tablet (75 mg total) by mouth daily with breakfast.  . Cyanocobalamin (VITAMIN B12 PO) Take 1 tablet by mouth daily.  . fluticasone (FLONASE) 50 MCG/ACT nasal spray Place 1 spray into both nostrils daily.  Marland Kitchen lisinopril (ZESTRIL) 20 MG tablet Take 1 tablet (20 mg total) by mouth daily.  Marland Kitchen loratadine (CLARITIN) 10 MG tablet Take 10 mg by mouth daily as needed for allergies.  . Magnesium Gluconate (MAGNESIUM 27 PO) Take 2 tablets by mouth daily.  . meloxicam (MOBIC) 7.5 MG tablet Take 15 mg by mouth daily.   . Multiple Vitamin (MULTIVITAMIN) tablet Take 1 tablet by mouth daily.  . nitroGLYCERIN (NITROSTAT) 0.4 MG SL tablet Place 1 tablet (0.4 mg total) under the tongue every 5 (five) minutes as  needed for chest pain (up to 3 doses).  . Omega-3 Fatty Acids (FISH OIL PO) Take 1 tablet by mouth daily.   . rosuvastatin (CRESTOR) 20 MG tablet Take 1 tablet (20 mg total) by mouth daily.  . vitamin C (ASCORBIC ACID) 500 MG tablet Take 1,500 mg by mouth daily.  Marland Kitchen zinc gluconate 50 MG tablet Take 100 mg by mouth daily.      Review of Systems    Review of Systems  Constitution: Negative for chills, fever and malaise/fatigue.  Cardiovascular: Negative for chest pain, dyspnea on exertion, irregular heartbeat, leg swelling, near-syncope and palpitations.  Respiratory: Negative for cough, shortness of breath and wheezing.   Gastrointestinal: Negative for nausea and vomiting.  Neurological: Negative for dizziness, light-headedness and weakness.   All other systems reviewed and are otherwise negative except as noted above.  Physical Exam    VS:  BP 120/90 (BP Location: Left Arm, Patient Position: Sitting, Cuff Size:  Normal)   Pulse (!) 52   Ht 6\' 1"  (1.854 m)   Wt 221 lb (100.2 kg)   SpO2 97%   BMI 29.16 kg/m  , BMI Body mass index is 29.16 kg/m. GEN: Well nourished, well developed, in no acute distress. HEENT: normal. Neck: Supple, no JVD, carotid bruits, or masses. Cardiac: RRR, no murmurs, rubs, or gallops. No clubbing, cyanosis, edema.  Radials/DP/PT 2+ and equal bilaterally.  Respiratory:  Respirations regular and unlabored, clear to auscultation bilaterally. GI: Soft, nontender, nondistended, BS + x 4. MS: No deformity or atrophy. Skin: Warm and dry, no rash. R radial cath site clean, dry, intact. Mild ecchymosis. No hematoma, no signs of infection.  Neuro:  Strength and sensation are intact. Psych: Normal affect.  Assessment & Plan    1. CAD s/p DES to RCA with residual disease - He has no anginal symptoms. R radial cath site healing appropriately. GDMT of DAPT ASA/Plavix and high intensity statin. No beta blocker secondary to bradycardia. Encouraged to participate in  cardiac rehab, he was referred to [facility] cardiac rehab.   2. HTN - BP improved after increased dose of Lisinopril while hospitalized. Continue present antihypertensive regimen.  3. HLD, LDL goal <70 - Crestor increased from 10mg  weekly to 40mg  daily while hospitalized. Tolerating well. Recheck lipid profile at upcoming office visit as he will have completed 6 weeks of therapy. If LDL not <70, likely add Zetia.   4. Syncope - Single episode felt to be due to dehydration. 30 day monitor by PCP with no acute findings. Seen by Dr. Curt Bears and recommended to return for loop recorder if symptoms recur.   Disposition: Follow up in 5 week(s) with Dr. Bettina Gavia. Lipid to be drawn at that visit.    Loel Dubonnet, NP 06/19/2019, 5:22 PM

## 2019-06-19 NOTE — Patient Instructions (Signed)
Medication Instructions:  No medication changes today.  *If you need a refill on your cardiac medications before your next appointment, please call your pharmacy*  Lab Work: No lab work today.  If you have labs (blood work) drawn today and your tests are completely normal, you will receive your results only by: Marland Kitchen MyChart Message (if you have MyChart) OR . A paper copy in the mail If you have any lab test that is abnormal or we need to change your treatment, we will call you to review the results.  Testing/Procedures: None today.   Follow-Up: At [facility], you and your health needs are our priority.  As part of our continuing mission to provide you with exceptional heart care, we have created designated Provider Care Teams.  These Care Teams include your primary Cardiologist (physician) and Advanced Practice Providers (APPs -  Physician Assistants and Nurse Practitioners) who all work together to provide you with the care you need, when you need it.  Your next appointment:   5 week(s)  The format for your next appointment:   In Person  Provider:   Shirlee More, MD  Other Instructions   Anticipate phone call from [place]. Would strongly encourage you to participate in this program.   Recommend low sodium heart healthy diet.    Continue walking. Be sure to listen to your body and rest as needed.   Your catheterization site looks good. The bruising will take up to a month to resolve completely.    Call our office to report lightheadedness, dizziness, chest pain, almost passing out, or passing out.

## 2019-07-26 NOTE — Progress Notes (Signed)
Cardiology Office Note:    Date:  07/28/2019   ID:  Steven Kennedy, DOB 1951-03-20, MRN [id number]  PCP:  Maryella Shivers, MD  Cardiologist:  Shirlee More, MD    Referring MD: Maryella Shivers, MD    ASSESSMENT:    1. Coronary artery disease involving native coronary artery of native heart without angina pectoris   2. Syncope and collapse   3. Essential hypertension   4. Mixed hyperlipidemia   5. Sinus bradycardia    PLAN:    In order of problems listed above:  1. Stable he had recent PCI and stent severe right coronary lesion and I suspect that was the mechanism of his post exertional syncope and collapse.  He will continue uninterrupted dual antiplatelet therapy for 1 year high intensity statin continue his home exercise program monitor heart rate and blood pressure and reassess in the office in 6 months 2. Ischemic in etiology status post PCI at this time would not recommend pacemaker or an implanted loop recorder 3. Stable hypertension pressures at target continue ACE inhibitor check labs including potassium renal function 4. Recheck lipid profile.  His prestatin cholesterol was 187 LDL 126 HDL 48 goal LDL cholesterol less than 70 and he may require Zetia along with a statin 5. Stable asymptomatic continue to monitor heart rate blood pressure at home has been seen by EP and not advised implanted loop recorder   Next appointment: 6 months   Medication Adjustments/Labs and Tests Ordered: Current medicines are reviewed at length with the patient today.  Concerns regarding medicines are outlined above.  No orders of the defined types were placed in this encounter.  No orders of the defined types were placed in this encounter.   No chief complaint on file.   History of Present Illness:    Steven Kennedy is a 69 y.o. male with a hx of a profound postexertional syncopal event subsequent diagnosis of coronary artery disease with PCI and drug-eluting stent right coronary  artery 06/29/2019 hypertension dyslipidemia.  He was last seen by Laurann Montana, NP 06/19/2019.  Had a virtual consultation with EP Dr. Curt Bears 06/16/2019 he did not think that he required an ICD or pacemaker and at that time decision was made not to pursue an implanted loop recorder.. Compliance with diet, lifestyle and medications: Yes  Reviewed his coronary angiogram results with the patient  Left heart catheterization: Conclusion    CULPRIT LESION: Dist RCA lesion is 85% stenosed.  A drug-eluting stent was successfully placed using a STENT RESOLUTE ONYX 2.5X12.  Post intervention, there is a 0% residual stenosis.  Prox RCA lesion is 60% stenosed. Mid RCA lesion is 45% stenosed.  Prox Cx lesion is 65% stenosed.  Ramus-1 lesion is 50% stenosed. Ramus-2 lesion is 55% stenosed. Lat Ramus lesion is 50% stenosed.  Ost LAD to Prox LAD lesion is 20% stenosed.  Dist LAD lesion is 90% stenosed.  LV end diastolic pressure is normal.  There is no aortic valve stenosis.   SUMMARY  Severe single-vessel disease with focal 85% distal RCA just prior to a bifurcating PDA (correlates with ischemic distribution on stress test)-> successfully treated with Resolute Onyx DES 2.5 mm x 12 mm (tapered from 2.9 to 2.6 mm)  Residual moderate disease in the proximal and mid RCA, proximal LCx and proximal RI.  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  TR band removal per protocol  Anticipate same-day discharge and follow-up with Dr.  Devinn Voshell  Clopidogrel 75 mg daily initiated.  No beta-blocker based on significant bradycardia in the Cath Lab (heart rates in the 40s)   Coronary Diagrams  Diagnostic Dominance: Co-dominant  Intervention     He has decided not to engage in cardiac rehabilitation but is not a good walking program feels well has no exercise intolerance chest pain shortness of breath palpitation and no recurrent syncope.   At home he checks his blood pressure runs runs 120-130/80-90 and heart rate is greater than 50 bpm and when asked him to continue the same.  At this time we have not recommended implanted loop recorder.  Pliant with dual antiplatelet therapy has had no abdominal pain or bleeding and tolerates his statin without muscle weakness or pain.  Will check liver function and lipid profile today and goal LDL is less than 70.  In the absence of recurrent syncope I will plan to see him in 6 months and he will continue to monitor his home heart rates. Past Medical History:  Diagnosis Date  . CAD (coronary artery disease)    a. abnormal nuc - cath 06/2019 s/p DES to dRCA with residual moderate disease in the proximal and mid RCA, proximal LCx and proximal RI. EF 55%.  . Dyslipidemia   . Hypertension   . Sinus bradycardia   . Skin cancer 2007  . Syncope and collapse     Past Surgical History:  Procedure Laterality Date  . CORONARY STENT INTERVENTION N/A 06/13/2019   Procedure: CORONARY STENT INTERVENTION;  Surgeon: Leonie Man, MD;  Location: [facility];  Service: Cardiovascular;  Laterality: N/A;  . LEFT HEART CATH AND CORONARY ANGIOGRAPHY N/A 06/13/2019   Procedure: LEFT HEART CATH AND CORONARY ANGIOGRAPHY;  Surgeon: Leonie Man, MD;  Location: [facility];  Service: Cardiovascular;  Laterality: N/A;  . SKIN CANCER EXCISION    . TOTAL HIP ARTHROPLASTY      Current Medications: Current Meds  Medication Sig  . amoxicillin (AMOXIL) 500 MG tablet Take 2,000 mg by mouth See admin instructions. Before Dental procedures  . aspirin EC 81 MG tablet Take 81 mg by mouth daily.  . clopidogrel (PLAVIX) 75 MG tablet Take 1 tablet (75 mg total) by mouth daily with breakfast.  . CRANBERRY EXTRACT PO Take 25,000 mg by mouth daily.  . Cyanocobalamin (VITAMIN B12 PO) Take 1 tablet by mouth daily.  . fluticasone (FLONASE) 50 MCG/ACT nasal spray Place 1 spray into both nostrils daily.  Marland Kitchen  lisinopril (ZESTRIL) 20 MG tablet Take 1 tablet (20 mg total) by mouth daily.  Marland Kitchen loratadine (CLARITIN) 10 MG tablet Take 10 mg by mouth daily as needed for allergies.  . Magnesium Gluconate (MAGNESIUM 27 PO) Take 2 tablets by mouth daily.  . meloxicam (MOBIC) 7.5 MG tablet Take 15 mg by mouth daily.   . Multiple Vitamin (MULTIVITAMIN) tablet Take 1 tablet by mouth daily.  . nitroGLYCERIN (NITROSTAT) 0.4 MG SL tablet Place 1 tablet (0.4 mg total) under the tongue every 5 (five) minutes as needed for chest pain (up to 3 doses).  . Omega-3 Fatty Acids (FISH OIL PO) Take 1 tablet by mouth daily.   . rosuvastatin (CRESTOR) 20 MG tablet Take 1 tablet (20 mg total) by mouth daily.  . vitamin C (ASCORBIC ACID) 500 MG tablet Take 1,500 mg by mouth daily.  Marland Kitchen zinc gluconate 50 MG tablet Take 100 mg by mouth daily.     Allergies:   Patient has no known  allergies.   Social History   Socioeconomic History  . Marital status: Married    Spouse name: Not on file  . Number of children: Not on file  . Years of education: Not on file  . Highest education level: Not on file  Occupational History  . Not on file  Tobacco Use  . Smoking status: Former Smoker    Packs/day: 0.50    Years: 25.00    Pack years: 12.50    Types: Cigarettes    Quit date: 1982    Years since quitting: 39.0  . Smokeless tobacco: Former Systems developer    Types: Chew    Quit date: 1982  Substance and Sexual Activity  . Alcohol use: Not Currently  . Drug use: Not Currently  . Sexual activity: Not on file  Other Topics Concern  . Not on file  Social History Narrative  . Not on file   Social Determinants of Health   Financial Resource Strain:   . Difficulty of Paying Living Expenses: Not on file  Food Insecurity:   . Worried About Charity fundraiser in the Last Year: Not on file  . Ran Out of Food in the Last Year: Not on file  Transportation Needs:   . Lack of Transportation (Medical): Not on file  . Lack of Transportation  (Non-Medical): Not on file  Physical Activity:   . Days of Exercise per Week: Not on file  . Minutes of Exercise per Session: Not on file  Stress:   . Feeling of Stress : Not on file  Social Connections:   . Frequency of Communication with Friends and Family: Not on file  . Frequency of Social Gatherings with Friends and Family: Not on file  . Attends Religious Services: Not on file  . Active Member of Clubs or Organizations: Not on file  . Attends Archivist Meetings: Not on file  . Marital Status: Not on file     Family History: The patient's family history includes Dementia in his paternal grandmother; Heart attack in his father, mother, and paternal grandfather; Hyperlipidemia in his father, mother, sister, and sister; Hypertension in his father, mother, sister, and sister; Stroke in his father. ROS:   Please see the history of present illness.    All other systems reviewed and are negative.  EKGs/Labs/Other Studies Reviewed:    The following studies were reviewed today:   Recent Labs: 06/04/2019: BUN 15; Creatinine, Ser 0.96; Hemoglobin 15.5; Platelets 196; Potassium 5.0; Sodium 140 06/13/2019: ALT 24; TSH 2.628  Recent Lipid Panel No results found for: CHOL, TRIG, HDL, CHOLHDL, VLDL, LDLCALC, LDLDIRECT  Physical Exam:    VS:  BP 120/84 (BP Location: Right Arm, Patient Position: Sitting, Cuff Size: Normal)   Pulse (!) 46   Ht 6\' 1"  (1.854 m)   Wt 225 lb (102.1 kg)   SpO2 95%   BMI 29.69 kg/m     Wt Readings from Last 3 Encounters:  07/28/19 225 lb (102.1 kg)  06/19/19 221 lb (100.2 kg)  06/13/19 222 lb (100.7 kg)     GEN:  Well nourished, well developed in no acute distress HEENT: Normal NECK: No JVD; No carotid bruits LYMPHATICS: No lymphadenopathy CARDIAC: RRR, no murmurs, rubs, gallops RESPIRATORY:  Clear to auscultation without rales, wheezing or rhonchi  ABDOMEN: Soft, non-tender, non-distended MUSCULOSKELETAL:  No edema; No deformity  SKIN:  Warm and dry NEUROLOGIC:  Alert and oriented x 3 PSYCHIATRIC:  Normal affect    Signed, Aaron Edelman  Bettina Gavia, MD  07/28/2019 11:18 AM    [place]

## 2019-07-28 ENCOUNTER — Encounter: Payer: Self-pay | Admitting: Cardiology

## 2019-07-28 ENCOUNTER — Ambulatory Visit (INDEPENDENT_AMBULATORY_CARE_PROVIDER_SITE_OTHER): Payer: PPO | Admitting: Cardiology

## 2019-07-28 ENCOUNTER — Other Ambulatory Visit: Payer: Self-pay

## 2019-07-28 VITALS — BP 120/84 | HR 46 | Ht 73.0 in | Wt 225.0 lb

## 2019-07-28 DIAGNOSIS — R001 Bradycardia, unspecified: Secondary | ICD-10-CM

## 2019-07-28 DIAGNOSIS — R55 Syncope and collapse: Secondary | ICD-10-CM

## 2019-07-28 DIAGNOSIS — E782 Mixed hyperlipidemia: Secondary | ICD-10-CM | POA: Diagnosis not present

## 2019-07-28 DIAGNOSIS — I10 Essential (primary) hypertension: Secondary | ICD-10-CM

## 2019-07-28 DIAGNOSIS — I2510 Atherosclerotic heart disease of native coronary artery without angina pectoris: Secondary | ICD-10-CM | POA: Diagnosis not present

## 2019-07-28 LAB — COMPREHENSIVE METABOLIC PANEL
ALT: 32 IU/L (ref 0–44)
AST: 29 IU/L (ref 0–40)
Albumin/Globulin Ratio: 1.8 (ref 1.2–2.2)
Albumin: 4.8 g/dL (ref 3.8–4.8)
Alkaline Phosphatase: 77 IU/L (ref 39–117)
BUN/Creatinine Ratio: 12 (ref 10–24)
BUN: 12 mg/dL (ref 8–27)
Bilirubin Total: 0.6 mg/dL (ref 0.0–1.2)
CO2: 24 mmol/L (ref 20–29)
Calcium: 9.5 mg/dL (ref 8.6–10.2)
Chloride: 102 mmol/L (ref 96–106)
Creatinine, Ser: 0.98 mg/dL (ref 0.76–1.27)
GFR calc Af Amer: 91 mL/min/{1.73_m2} (ref >59)
GFR calc non Af Amer: 78 mL/min/{1.73_m2} (ref >59)
Globulin, Total: 2.6 g/dL (ref 1.5–4.5)
Glucose: 90 mg/dL (ref 65–99)
Potassium: 5.4 mmol/L — ABNORMAL HIGH (ref 3.5–5.2)
Sodium: 139 mmol/L (ref 134–144)
Total Protein: 7.4 g/dL (ref 6.0–8.5)

## 2019-07-28 LAB — LIPID PANEL
Chol/HDL Ratio: 2.5 ratio (ref 0.0–5.0)
Cholesterol, Total: 106 mg/dL (ref 100–199)
HDL: 43 mg/dL (ref >39)
LDL Chol Calc (NIH): 50 mg/dL (ref 0–99)
Triglycerides: 58 mg/dL (ref 0–149)
VLDL Cholesterol Cal: 13 mg/dL (ref 5–40)

## 2019-07-28 NOTE — Patient Instructions (Signed)
Medication Instructions:  Your physician recommends that you continue on your current medications as directed. Please refer to the Current Medication list given to you today.  *If you need a refill on your cardiac medications before your next appointment, please call your pharmacy*  Lab Work: [place]- Today   If you have labs (blood work) drawn today and your tests are completely normal, you will receive your results only by: Marland Kitchen MyChart Message (if you have MyChart) OR . A paper copy in the mail If you have any lab test that is abnormal or we need to change your treatment, we will call you to review the results.  Testing/Procedures: None ordered   Follow-Up: At [facility], you and your health needs are our priority.  As part of our continuing mission to provide you with exceptional heart care, we have created designated Provider Care Teams.  These Care Teams include your primary Cardiologist (physician) and Advanced Practice Providers (APPs -  Physician Assistants and Nurse Practitioners) who all work together to provide you with the care you need, when you need it.  Your next appointment:   6 month(s)  The format for your next appointment:   In Person  Provider:   Shirlee More, MD  Other Instructions Please continue to monitor your heart rate and blood pressure at home

## 2019-07-29 ENCOUNTER — Telehealth: Payer: Self-pay | Admitting: Emergency Medicine

## 2019-07-29 DIAGNOSIS — I10 Essential (primary) hypertension: Secondary | ICD-10-CM

## 2019-07-29 NOTE — Telephone Encounter (Signed)
Called patient informed his wife per dpr of lab results and that we will need to repeat labs in one week per Dr. Bettina Gavia. She verbally understood no further questions.

## 2019-08-06 DIAGNOSIS — I10 Essential (primary) hypertension: Secondary | ICD-10-CM | POA: Diagnosis not present

## 2019-08-07 ENCOUNTER — Other Ambulatory Visit: Payer: Self-pay | Admitting: *Deleted

## 2019-08-07 ENCOUNTER — Telehealth: Payer: Self-pay | Admitting: *Deleted

## 2019-08-07 DIAGNOSIS — I2510 Atherosclerotic heart disease of native coronary artery without angina pectoris: Secondary | ICD-10-CM

## 2019-08-07 DIAGNOSIS — E875 Hyperkalemia: Secondary | ICD-10-CM

## 2019-08-07 LAB — BASIC METABOLIC PANEL
BUN/Creatinine Ratio: 11 (ref 10–24)
BUN: 11 mg/dL (ref 8–27)
CO2: 26 mmol/L (ref 20–29)
Calcium: 9.7 mg/dL (ref 8.6–10.2)
Chloride: 105 mmol/L (ref 96–106)
Creatinine, Ser: 0.99 mg/dL (ref 0.76–1.27)
GFR calc Af Amer: 89 mL/min/{1.73_m2} (ref >59)
GFR calc non Af Amer: 77 mL/min/{1.73_m2} (ref >59)
Glucose: 81 mg/dL (ref 65–99)
Potassium: 6.1 mmol/L — ABNORMAL HIGH (ref 3.5–5.2)
Sodium: 142 mmol/L (ref 134–144)

## 2019-08-07 MED ORDER — LOKELMA 10 G PO PACK: 10.0000 g | PACK | Freq: Every day | ORAL | 0 refills | Status: AC

## 2019-08-07 MED ORDER — AMLODIPINE BESYLATE 5 MG PO TABS: 5.0000 mg | ORAL_TABLET | Freq: Every day | ORAL | 2 refills | Status: DC

## 2019-08-07 NOTE — Telephone Encounter (Signed)
Patient's wife notified. Will send prescriptions to [facility] on [address]. There are no samples of lokelma in the office.  Patient will come to office on Monday for BMP

## 2019-08-07 NOTE — Telephone Encounter (Signed)
-----   Message from Richardo Priest, MD sent at 08/07/2019  7:36 AM EST ----- His potassium remains elevated  Lisinopril is to be stopped  Start amlodipine 5 mg daily  I would like for him to start lokelma 10 g packet we may have some samples in the office I like him to take 1 daily for 3 doses and come back and have a BMP performed on Monday.

## 2019-08-19 DIAGNOSIS — E875 Hyperkalemia: Secondary | ICD-10-CM | POA: Diagnosis not present

## 2019-08-19 DIAGNOSIS — I2510 Atherosclerotic heart disease of native coronary artery without angina pectoris: Secondary | ICD-10-CM | POA: Diagnosis not present

## 2019-08-19 LAB — BASIC METABOLIC PANEL
BUN/Creatinine Ratio: 16 (ref 10–24)
BUN: 15 mg/dL (ref 8–27)
CO2: 23 mmol/L (ref 20–29)
Calcium: 9 mg/dL (ref 8.6–10.2)
Chloride: 104 mmol/L (ref 96–106)
Creatinine, Ser: 0.94 mg/dL (ref 0.76–1.27)
GFR calc Af Amer: 95 mL/min/{1.73_m2} (ref >59)
GFR calc non Af Amer: 82 mL/min/{1.73_m2} (ref >59)
Glucose: 92 mg/dL (ref 65–99)
Potassium: 4.5 mmol/L (ref 3.5–5.2)
Sodium: 140 mmol/L (ref 134–144)

## 2020-02-09 ENCOUNTER — Other Ambulatory Visit: Payer: Self-pay | Admitting: Physician Assistant

## 2020-02-10 ENCOUNTER — Other Ambulatory Visit: Payer: Self-pay

## 2020-02-10 ENCOUNTER — Ambulatory Visit: Payer: PPO | Admitting: Cardiology

## 2020-02-10 ENCOUNTER — Encounter: Payer: Self-pay | Admitting: Cardiology

## 2020-02-10 VITALS — BP 130/76 | HR 54 | Ht 73.0 in | Wt 222.0 lb

## 2020-02-10 DIAGNOSIS — I2510 Atherosclerotic heart disease of native coronary artery without angina pectoris: Secondary | ICD-10-CM

## 2020-02-10 DIAGNOSIS — I10 Essential (primary) hypertension: Secondary | ICD-10-CM

## 2020-02-10 DIAGNOSIS — E782 Mixed hyperlipidemia: Secondary | ICD-10-CM

## 2020-02-10 DIAGNOSIS — R55 Syncope and collapse: Secondary | ICD-10-CM | POA: Diagnosis not present

## 2020-02-10 LAB — COMPREHENSIVE METABOLIC PANEL
ALT: 29 IU/L (ref 0–44)
AST: 25 IU/L (ref 0–40)
Albumin/Globulin Ratio: 2 (ref 1.2–2.2)
Albumin: 4.7 g/dL (ref 3.8–4.8)
Alkaline Phosphatase: 80 IU/L (ref 48–121)
BUN/Creatinine Ratio: 13 (ref 10–24)
BUN: 14 mg/dL (ref 8–27)
Bilirubin Total: 0.7 mg/dL (ref 0.0–1.2)
CO2: 22 mmol/L (ref 20–29)
Calcium: 9.3 mg/dL (ref 8.6–10.2)
Chloride: 103 mmol/L (ref 96–106)
Creatinine, Ser: 1.04 mg/dL (ref 0.76–1.27)
GFR calc Af Amer: 84 mL/min/{1.73_m2} (ref >59)
GFR calc non Af Amer: 73 mL/min/{1.73_m2} (ref >59)
Globulin, Total: 2.3 g/dL (ref 1.5–4.5)
Glucose: 96 mg/dL (ref 65–99)
Potassium: 4.6 mmol/L (ref 3.5–5.2)
Sodium: 140 mmol/L (ref 134–144)
Total Protein: 7 g/dL (ref 6.0–8.5)

## 2020-02-10 LAB — LIPID PANEL
Chol/HDL Ratio: 2.2 ratio (ref 0.0–5.0)
Cholesterol, Total: 102 mg/dL (ref 100–199)
HDL: 46 mg/dL (ref >39)
LDL Chol Calc (NIH): 45 mg/dL (ref 0–99)
Triglycerides: 41 mg/dL (ref 0–149)
VLDL Cholesterol Cal: 11 mg/dL (ref 5–40)

## 2020-02-10 NOTE — Patient Instructions (Signed)

## 2020-02-10 NOTE — Progress Notes (Signed)
Cardiology Office Note:    Date:  02/10/2020   ID:  Steven Kennedy, DOB September 09, 1950, MRN [id number]  PCP:  Maryella Shivers, MD  Cardiologist:  Shirlee More, MD    Referring MD: Maryella Shivers, MD    ASSESSMENT:    1. Coronary artery disease involving native coronary artery of native heart without angina pectoris   2. Essential hypertension   3. Mixed hyperlipidemia   4. Syncope and collapse    PLAN:    In order of problems listed above:  1. He has done well since PCI and stent right coronary artery we will recheck his EKG today continue medical therapy including dual antiplatelet aspirin clopidogrel his calcium channel blocker and high intensity statin. He is not on a beta-blocker with resting bradycardia and previous syncope. At this time I do not think requires an ischemia evaluation. I will keep him on dual antiplatelet therapy till his next visit 2. Continue his current antihypertensive blood pressure target 3. Continue statin tolerated well recheck CMP and lipid profile 4. No recurrence. He has been seen by EP   Next appointment: 6 months   Medication Adjustments/Labs and Tests Ordered: Current medicines are reviewed at length with the patient today.  Concerns regarding medicines are outlined above.  No orders of the defined types were placed in this encounter.  No orders of the defined types were placed in this encounter.   No chief complaint on file.   History of Present Illness:    Steven Kennedy is a 69 y.o. male with a hx of postexertional syncopal event subsequent diagnosis of coronary artery disease with PCI and drug-eluting stent right coronary artery 06/29/2019 hypertension dyslipidemia. Had a virtual consultation with EP Dr. Curt Bears 06/16/2019 he did not think that he required an ICD or pacemaker and at that time decision was made not to pursue an implanted loop recorder.Marland Kitchen  He was last seen 07/28/2019. Compliance with diet, lifestyle and medications:  Yes  He continues to do well very vigorous and active his only complaint is bruising when he is doing gardening work and traumatizes his upper extremities. He has had no clinical bleeding tolerates statin without muscle pain or weakness and feels well no is no exercise intolerance chest pain shortness of breath palpitation or recurrent syncope. Past Medical History:  Diagnosis Date  . CAD (coronary artery disease)    a. abnormal nuc - cath 06/2019 s/p DES to dRCA with residual moderate disease in the proximal and mid RCA, proximal LCx and proximal RI. EF 55%.  . Dyslipidemia   . Hypertension   . Sinus bradycardia   . Skin cancer 2007  . Syncope and collapse     Past Surgical History:  Procedure Laterality Date  . CORONARY STENT INTERVENTION N/A 06/13/2019   Procedure: CORONARY STENT INTERVENTION;  Surgeon: Leonie Man, MD;  Location: [place] [facility];  Service: Cardiovascular;  Laterality: N/A;  . LEFT HEART CATH AND CORONARY ANGIOGRAPHY N/A 06/13/2019   Procedure: LEFT HEART CATH AND CORONARY ANGIOGRAPHY;  Surgeon: Leonie Man, MD;  Location: [place] [facility];  Service: Cardiovascular;  Laterality: N/A;  . SKIN CANCER EXCISION    . TOTAL HIP ARTHROPLASTY      Current Medications: Current Meds  Medication Sig  . amLODipine (NORVASC) 5 MG tablet Take 1 tablet (5 mg total) by mouth daily.  Marland Kitchen amoxicillin (AMOXIL) 500 MG tablet Take 2,000 mg by mouth See admin instructions. Before Dental procedures  . aspirin EC 81  MG tablet Take 81 mg by mouth daily.  . clopidogrel (PLAVIX) 75 MG tablet Take 1 tablet (75 mg total) by mouth daily with breakfast.  . CRANBERRY EXTRACT PO Take 25,000 mg by mouth daily.  . Cyanocobalamin (VITAMIN B12 PO) Take 1 tablet by mouth daily.  . fluticasone (FLONASE) 50 MCG/ACT nasal spray Place 1 spray into both nostrils daily.  Marland Kitchen loratadine (CLARITIN) 10 MG tablet Take 10 mg by mouth daily as needed for allergies.  . Magnesium Gluconate (MAGNESIUM 27  PO) Take 2 tablets by mouth daily.  . Multiple Vitamin (MULTIVITAMIN) tablet Take 1 tablet by mouth daily.  . Omega-3 Fatty Acids (FISH OIL PO) Take 1 tablet by mouth daily.   . rosuvastatin (CRESTOR) 20 MG tablet TAKE 1 TABLET BY MOUTH EVERY DAY  . vitamin C (ASCORBIC ACID) 500 MG tablet Take 1,500 mg by mouth daily.  Marland Kitchen zinc gluconate 50 MG tablet Take 100 mg by mouth daily.     Allergies:   Patient has no known allergies.   Social History   Socioeconomic History  . Marital status: Married    Spouse name: Not on file  . Number of children: Not on file  . Years of education: Not on file  . Highest education level: Not on file  Occupational History  . Not on file  Tobacco Use  . Smoking status: Former Smoker    Packs/day: 0.50    Years: 25.00    Pack years: 12.50    Types: Cigarettes    Quit date: 1982    Years since quitting: 39.6  . Smokeless tobacco: Former Systems developer    Types: [place] date: 1982  Media planner  . Vaping Use: Never used  Substance and Sexual Activity  . Alcohol use: Not Currently  . Drug use: Not Currently  . Sexual activity: Not on file  Other Topics Concern  . Not on file  Social History Narrative  . Not on file   Social Determinants of Health   Financial Resource Strain:   . Difficulty of Paying Living Expenses:   Food Insecurity:   . Worried About Charity fundraiser in the Last Year:   . Arboriculturist in the Last Year:   Transportation Needs:   . Film/video editor (Medical):   Marland Kitchen Lack of Transportation (Non-Medical):   Physical Activity:   . Days of Exercise per Week:   . Minutes of Exercise per Session:   Stress:   . Feeling of Stress :   Social Connections:   . Frequency of Communication with Friends and Family:   . Frequency of Social Gatherings with Friends and Family:   . Attends Religious Services:   . Active Member of Clubs or Organizations:   . Attends Archivist Meetings:   Marland Kitchen Marital Status:      Family  History: The patient's family history includes Dementia in his paternal grandmother; Heart attack in his father, mother, and paternal grandfather; Hyperlipidemia in his father, mother, sister, and sister; Hypertension in his father, mother, sister, and sister; Stroke in his father. ROS:   Please see the history of present illness.    All other systems reviewed and are negative.  EKGs/Labs/Other Studies Reviewed:    The following studies were reviewed today:  EKG:  EKG ordered today and personally reviewed.  The ekg ordered today demonstrates sinus bradycardia 54 bpm normal EKG  Recent Labs: 06/04/2019: Hemoglobin 15.5; Platelets 196 06/13/2019: TSH 2.628  07/28/2019: ALT 32 08/19/2019: BUN 15; Creatinine, Ser 0.94; Potassium 4.5; Sodium 140  Recent Lipid Panel    Component Value Date/Time   CHOL 106 07/28/2019 1127   TRIG 58 07/28/2019 1127   HDL 43 07/28/2019 1127   CHOLHDL 2.5 07/28/2019 1127   LDLCALC 50 07/28/2019 1127    Physical Exam:    VS:  BP 130/76   Pulse (!) 54   Ht 6\' 1"  (1.854 m)   Wt 222 lb (100.7 kg)   BMI 29.29 kg/m     Wt Readings from Last 3 Encounters:  02/10/20 222 lb (100.7 kg)  07/28/19 225 lb (102.1 kg)  06/19/19 221 lb (100.2 kg)     GEN:  Well nourished, well developed in no acute distress HEENT: Normal NECK: No JVD; No carotid bruits LYMPHATICS: No lymphadenopathy CARDIAC: RRR, no murmurs, rubs, gallops RESPIRATORY:  Clear to auscultation without rales, wheezing or rhonchi  ABDOMEN: Soft, non-tender, non-distended MUSCULOSKELETAL:  No edema; No deformity  SKIN: Warm and dry NEUROLOGIC:  Alert and oriented x 3 PSYCHIATRIC:  Normal affect    Signed, Shirlee More, MD  02/10/2020 8:05 AM    [place]

## 2020-02-11 ENCOUNTER — Telehealth: Payer: Self-pay

## 2020-02-11 NOTE — Telephone Encounter (Signed)
-----   Message from Richardo Priest, MD sent at 02/11/2020  7:56 AM EDT ----- Normal or stable result  No changes

## 2020-02-11 NOTE — Telephone Encounter (Signed)
Spoke with patient regarding results and recommendation.  Patient verbalizes understanding and is agreeable to plan of care. Advised patient to call back with any issues or concerns.  

## 2020-03-04 DIAGNOSIS — Z136 Encounter for screening for cardiovascular disorders: Secondary | ICD-10-CM | POA: Diagnosis not present

## 2020-03-04 DIAGNOSIS — S39012A Strain of muscle, fascia and tendon of lower back, initial encounter: Secondary | ICD-10-CM | POA: Diagnosis not present

## 2020-03-04 DIAGNOSIS — I2510 Atherosclerotic heart disease of native coronary artery without angina pectoris: Secondary | ICD-10-CM | POA: Diagnosis not present

## 2020-03-04 DIAGNOSIS — Z6831 Body mass index (BMI) 31.0-31.9, adult: Secondary | ICD-10-CM | POA: Diagnosis not present

## 2020-03-04 DIAGNOSIS — Z1389 Encounter for screening for other disorder: Secondary | ICD-10-CM | POA: Diagnosis not present

## 2020-03-04 DIAGNOSIS — Z955 Presence of coronary angioplasty implant and graft: Secondary | ICD-10-CM | POA: Diagnosis not present

## 2020-03-04 DIAGNOSIS — Z139 Encounter for screening, unspecified: Secondary | ICD-10-CM | POA: Diagnosis not present

## 2020-03-04 DIAGNOSIS — Z1331 Encounter for screening for depression: Secondary | ICD-10-CM | POA: Diagnosis not present

## 2020-03-04 DIAGNOSIS — Z0000 Encounter for general adult medical examination without abnormal findings: Secondary | ICD-10-CM | POA: Diagnosis not present

## 2020-04-19 ENCOUNTER — Other Ambulatory Visit: Payer: Self-pay | Admitting: Cardiology

## 2020-04-20 ENCOUNTER — Other Ambulatory Visit: Payer: Self-pay | Admitting: Cardiology

## 2020-04-20 DIAGNOSIS — D1801 Hemangioma of skin and subcutaneous tissue: Secondary | ICD-10-CM | POA: Diagnosis not present

## 2020-04-20 DIAGNOSIS — D225 Melanocytic nevi of trunk: Secondary | ICD-10-CM | POA: Diagnosis not present

## 2020-04-20 DIAGNOSIS — L570 Actinic keratosis: Secondary | ICD-10-CM | POA: Diagnosis not present

## 2020-04-20 DIAGNOSIS — Z85820 Personal history of malignant melanoma of skin: Secondary | ICD-10-CM | POA: Diagnosis not present

## 2020-04-20 DIAGNOSIS — D485 Neoplasm of uncertain behavior of skin: Secondary | ICD-10-CM | POA: Diagnosis not present

## 2020-04-20 MED ORDER — CLOPIDOGREL BISULFATE 75 MG PO TABS: 75.0000 mg | ORAL_TABLET | Freq: Every day | ORAL | 3 refills | Status: DC

## 2020-04-20 NOTE — Telephone Encounter (Signed)
Refill sent in per request.  

## 2020-04-20 NOTE — Telephone Encounter (Signed)
*  STAT* If patient is at the pharmacy, call can be transferred to refill team.   1. Which medications need to be refilled? (please list name of each medication and dose if known) Clopidogrel   2. Which pharmacy/location (including street and city if local pharmacy) is medication to be sent to? [facility] [address], [place],  3. Do they need a 30 day or 90 day supply? 90 days and refills

## 2020-05-03 ENCOUNTER — Other Ambulatory Visit: Payer: Self-pay | Admitting: Cardiology

## 2020-05-04 DIAGNOSIS — D485 Neoplasm of uncertain behavior of skin: Secondary | ICD-10-CM | POA: Diagnosis not present

## 2020-06-29 DIAGNOSIS — Z6831 Body mass index (BMI) 31.0-31.9, adult: Secondary | ICD-10-CM | POA: Diagnosis not present

## 2020-06-29 DIAGNOSIS — M19041 Primary osteoarthritis, right hand: Secondary | ICD-10-CM | POA: Diagnosis not present

## 2020-06-29 DIAGNOSIS — I2510 Atherosclerotic heart disease of native coronary artery without angina pectoris: Secondary | ICD-10-CM | POA: Diagnosis not present

## 2020-06-29 DIAGNOSIS — Z23 Encounter for immunization: Secondary | ICD-10-CM | POA: Diagnosis not present

## 2020-06-29 DIAGNOSIS — M19042 Primary osteoarthritis, left hand: Secondary | ICD-10-CM | POA: Diagnosis not present

## 2020-07-29 DIAGNOSIS — Z1152 Encounter for screening for COVID-19: Secondary | ICD-10-CM | POA: Diagnosis not present

## 2020-07-29 DIAGNOSIS — Z6831 Body mass index (BMI) 31.0-31.9, adult: Secondary | ICD-10-CM | POA: Diagnosis not present

## 2020-07-29 DIAGNOSIS — Z20822 Contact with and (suspected) exposure to covid-19: Secondary | ICD-10-CM | POA: Diagnosis not present

## 2020-07-29 DIAGNOSIS — Z0000 Encounter for general adult medical examination without abnormal findings: Secondary | ICD-10-CM | POA: Diagnosis not present

## 2020-07-29 DIAGNOSIS — U071 COVID-19: Secondary | ICD-10-CM | POA: Diagnosis not present

## 2020-08-01 ENCOUNTER — Other Ambulatory Visit: Payer: Self-pay | Admitting: Cardiology

## 2020-08-02 NOTE — Telephone Encounter (Signed)
Refill sent to pharmacy.   

## 2020-08-06 DIAGNOSIS — I2510 Atherosclerotic heart disease of native coronary artery without angina pectoris: Secondary | ICD-10-CM | POA: Insufficient documentation

## 2020-08-06 DIAGNOSIS — E785 Hyperlipidemia, unspecified: Secondary | ICD-10-CM | POA: Insufficient documentation

## 2020-08-06 DIAGNOSIS — I10 Essential (primary) hypertension: Secondary | ICD-10-CM | POA: Insufficient documentation

## 2020-08-22 NOTE — Progress Notes (Signed)
Cardiology Office Note:    Date:  08/23/2020   ID:  Steven Kennedy, DOB 1951-02-08, MRN [id number]  PCP:  Maryella Shivers, MD  Cardiologist:  Shirlee More, MD    Referring MD: Maryella Shivers, MD    ASSESSMENT:    1. Coronary artery disease involving native coronary artery of native heart without angina pectoris   2. Essential hypertension   3. Mixed hyperlipidemia    PLAN:    In order of problems listed above:  1. Stable CAD following PCI and stent severe right coronary artery lesion December 2020 with exertional syncope.  He is bradycardic at rest with not placed on a beta-blocker.  He is having no anginal discomfort on current medical therapy I do not think he needs a repeat ischemia evaluation at this time. 2. BP is elevated I asked him to trend at home and if systolics are greater than 628 diastolics greater than 90 to contact me or his PCP to doubt additional therapy to his long-acting calcium channel blocker.  We will recheck his blood pressure prior to leaving the office 3. Stable lipids are ideal on high intensity statin well-tolerated   Next appointment: I will plan to see him back in my office in 1 year.   Medication Adjustments/Labs and Tests Ordered: Current medicines are reviewed at length with the patient today.  Concerns regarding medicines are outlined above.  Orders Placed This Encounter  Procedures  . EKG 12-Lead   No orders of the defined types were placed in this encounter.   Chief Complaint  Patient presents with  . Follow-up  . Coronary Artery Disease    History of Present Illness:    Steven Kennedy is a 70 y.o. male with a hx of post exertional syncope CAD PCI and drug-eluting stent right coronary artery 06/29/2019 hypertension and dyslipidemia last seen 03/08/2020. 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 He has no exercise intolerance shortness of breath  chest pain palpitation or syncope. He is on dual antiplatelet therapy without GI side effects or bleeding and tolerates his statin without muscle pain or weakness.  Most recent labs performed 02/10/2020 with his PCP showed lipids to be ideal cholesterol 102 LDL 45 triglycerides 41 HDL 46 A1c 5.1%. Past Medical History:  Diagnosis Date  . CAD (coronary artery disease)    a. abnormal nuc - cath 06/2019 s/p DES to dRCA with residual moderate disease in the proximal and mid RCA, proximal LCx and proximal RI. EF 55%.  . Dyslipidemia   . Hypertension   . Sinus bradycardia   . Skin cancer 2007  . Syncope and collapse     Past Surgical History:  Procedure Laterality Date  . CORONARY STENT INTERVENTION N/A 06/13/2019   Procedure: CORONARY STENT INTERVENTION;  Surgeon: Leonie Man, MD;  Location: [place] [facility];  Service: Cardiovascular;  Laterality: N/A;  . LEFT HEART CATH AND CORONARY ANGIOGRAPHY N/A 06/13/2019   Procedure: LEFT HEART CATH AND CORONARY ANGIOGRAPHY;  Surgeon: Leonie Man, MD;  Location: [place] [facility];  Service: Cardiovascular;  Laterality: N/A;  . SKIN CANCER EXCISION    . TOTAL HIP ARTHROPLASTY      Current Medications: Current Meds  Medication Sig  . amLODipine (NORVASC) 5 MG tablet TAKE 1 TABLET BY MOUTH EVERY DAY  . amoxicillin (AMOXIL) 500 MG tablet Take 2,000 mg by mouth See admin instructions. Before Dental procedures  .  aspirin EC 81 MG tablet Take 81 mg by mouth daily.  . clopidogrel (PLAVIX) 75 MG tablet Take 1 tablet (75 mg total) by mouth daily with breakfast.  . CRANBERRY EXTRACT PO Take 25,000 mg by mouth daily.  . Cyanocobalamin (VITAMIN B12 PO) Take 1 tablet by mouth daily.  . fluticasone (FLONASE) 50 MCG/ACT nasal spray Place 1 spray into both nostrils daily.  Marland Kitchen loratadine (CLARITIN) 10 MG tablet Take 10 mg by mouth daily as needed for allergies.  . Magnesium Gluconate (MAGNESIUM 27 PO) Take 2 tablets by mouth daily.  . Multiple Vitamin  (MULTIVITAMIN) tablet Take 1 tablet by mouth daily.  . Omega-3 Fatty Acids (FISH OIL PO) Take 1 tablet by mouth daily.   . rosuvastatin (CRESTOR) 20 MG tablet TAKE 1 TABLET BY MOUTH EVERY DAY  . Turmeric 500 MG CAPS Take 3,000 mg by mouth daily.  . vitamin C (ASCORBIC ACID) 500 MG tablet Take 1,500 mg by mouth daily.  Marland Kitchen zinc gluconate 50 MG tablet Take 100 mg by mouth daily.     Allergies:   Patient has no known allergies.   Social History   Socioeconomic History  . Marital status: Married    Spouse name: Not on file  . Number of children: Not on file  . Years of education: Not on file  . Highest education level: Not on file  Occupational History  . Not on file  Tobacco Use  . Smoking status: Former Smoker    Packs/day: 0.50    Years: 25.00    Pack years: 12.50    Types: Cigarettes    Quit date: 1982    Years since quitting: 40.1  . Smokeless tobacco: Former Systems developer    Types: [place] date: 1982  Media planner  . Vaping Use: Never used  Substance and Sexual Activity  . Alcohol use: Not Currently  . Drug use: Not Currently  . Sexual activity: Not on file  Other Topics Concern  . Not on file  Social History Narrative  . Not on file   Social Determinants of Health   Financial Resource Strain: Not on file  Food Insecurity: Not on file  Transportation Needs: Not on file  Physical Activity: Not on file  Stress: Not on file  Social Connections: Not on file     Family History: The patient's family history includes Dementia in his paternal grandmother; Heart attack in his father, mother, and paternal grandfather; Hyperlipidemia in his father, mother, sister, and sister; Hypertension in his father, mother, sister, and sister; Stroke in his father. ROS:   Please see the history of present illness.    All other systems reviewed and are negative.  EKGs/Labs/Other Studies Reviewed:    The following studies were reviewed today:  EKG:  EKG ordered today and personally  reviewed.  The ekg ordered today demonstrates sinus bradycardia 50 bpm first-degree AV block.  He has abnormality early in the ST segment almost an Echo wave.  Recent Labs: 02/10/2020: ALT 29; BUN 14; Creatinine, Ser 1.04; Potassium 4.6; Sodium 140  Recent Lipid Panel    Component Value Date/Time   CHOL 102 02/10/2020 0828   TRIG 41 02/10/2020 0828   HDL 46 02/10/2020 0828   CHOLHDL 2.2 02/10/2020 0828   LDLCALC 45 02/10/2020 0828    Physical Exam:    VS:  BP (!) 150/90   Pulse (!) 50   Ht 6\' 1"  (1.854 m)   Wt 225 lb (102.1 kg)  SpO2 95%   BMI 29.69 kg/m     Wt Readings from Last 3 Encounters:  08/23/20 225 lb (102.1 kg)  02/10/20 222 lb (100.7 kg)  07/28/19 225 lb (102.1 kg)     GEN: He looks younger than his age well nourished, well developed in no acute distress HEENT: Normal NECK: No JVD; No carotid bruits LYMPHATICS: No lymphadenopathy CARDIAC: RRR, no murmurs, rubs, gallops RESPIRATORY:  Clear to auscultation without rales, wheezing or rhonchi  ABDOMEN: Soft, non-tender, non-distended MUSCULOSKELETAL:  No edema; No deformity  SKIN: Warm and dry NEUROLOGIC:  Alert and oriented x 3 PSYCHIATRIC:  Normal affect    Signed, Shirlee More, MD  08/23/2020 8:25 AM    [facility]

## 2020-08-23 ENCOUNTER — Encounter: Payer: Self-pay | Admitting: Cardiology

## 2020-08-23 ENCOUNTER — Other Ambulatory Visit: Payer: Self-pay

## 2020-08-23 ENCOUNTER — Ambulatory Visit: Payer: PPO | Admitting: Cardiology

## 2020-08-23 VITALS — BP 142/86 | HR 50 | Ht 73.0 in | Wt 225.0 lb

## 2020-08-23 DIAGNOSIS — I2510 Atherosclerotic heart disease of native coronary artery without angina pectoris: Secondary | ICD-10-CM

## 2020-08-23 DIAGNOSIS — I10 Essential (primary) hypertension: Secondary | ICD-10-CM

## 2020-08-23 DIAGNOSIS — E782 Mixed hyperlipidemia: Secondary | ICD-10-CM | POA: Diagnosis not present

## 2020-08-23 NOTE — Patient Instructions (Signed)

## 2020-10-13 DIAGNOSIS — R29898 Other symptoms and signs involving the musculoskeletal system: Secondary | ICD-10-CM | POA: Diagnosis not present

## 2020-10-13 DIAGNOSIS — M79609 Pain in unspecified limb: Secondary | ICD-10-CM | POA: Diagnosis not present

## 2020-10-13 DIAGNOSIS — Z6831 Body mass index (BMI) 31.0-31.9, adult: Secondary | ICD-10-CM | POA: Diagnosis not present

## 2020-10-13 DIAGNOSIS — M542 Cervicalgia: Secondary | ICD-10-CM | POA: Diagnosis not present

## 2020-10-13 DIAGNOSIS — M25511 Pain in right shoulder: Secondary | ICD-10-CM | POA: Diagnosis not present

## 2020-10-19 DIAGNOSIS — C44212 Basal cell carcinoma of skin of right ear and external auricular canal: Secondary | ICD-10-CM | POA: Diagnosis not present

## 2020-10-19 DIAGNOSIS — L570 Actinic keratosis: Secondary | ICD-10-CM | POA: Diagnosis not present

## 2020-10-25 DIAGNOSIS — M75101 Unspecified rotator cuff tear or rupture of right shoulder, not specified as traumatic: Secondary | ICD-10-CM | POA: Diagnosis not present

## 2020-11-24 DIAGNOSIS — M12819 Other specific arthropathies, not elsewhere classified, unspecified shoulder: Secondary | ICD-10-CM

## 2020-11-24 DIAGNOSIS — M19111 Post-traumatic osteoarthritis, right shoulder: Secondary | ICD-10-CM | POA: Diagnosis not present

## 2020-11-24 DIAGNOSIS — M75100 Unspecified rotator cuff tear or rupture of unspecified shoulder, not specified as traumatic: Secondary | ICD-10-CM | POA: Insufficient documentation

## 2020-11-24 HISTORY — DX: Other specific arthropathies, not elsewhere classified, unspecified shoulder: M12.819

## 2021-01-11 ENCOUNTER — Other Ambulatory Visit: Payer: Self-pay | Admitting: Cardiology

## 2021-01-25 ENCOUNTER — Other Ambulatory Visit: Payer: Self-pay | Admitting: Cardiology

## 2021-04-26 DIAGNOSIS — C44519 Basal cell carcinoma of skin of other part of trunk: Secondary | ICD-10-CM | POA: Diagnosis not present

## 2021-04-26 DIAGNOSIS — L570 Actinic keratosis: Secondary | ICD-10-CM | POA: Diagnosis not present

## 2021-05-03 DIAGNOSIS — M9902 Segmental and somatic dysfunction of thoracic region: Secondary | ICD-10-CM | POA: Diagnosis not present

## 2021-05-03 DIAGNOSIS — M9903 Segmental and somatic dysfunction of lumbar region: Secondary | ICD-10-CM | POA: Diagnosis not present

## 2021-05-03 DIAGNOSIS — M62830 Muscle spasm of back: Secondary | ICD-10-CM | POA: Diagnosis not present

## 2021-05-03 DIAGNOSIS — M9905 Segmental and somatic dysfunction of pelvic region: Secondary | ICD-10-CM | POA: Diagnosis not present

## 2021-05-03 DIAGNOSIS — M5416 Radiculopathy, lumbar region: Secondary | ICD-10-CM | POA: Diagnosis not present

## 2021-05-16 DIAGNOSIS — Z23 Encounter for immunization: Secondary | ICD-10-CM | POA: Diagnosis not present

## 2021-07-11 ENCOUNTER — Other Ambulatory Visit: Payer: Self-pay | Admitting: Cardiology

## 2021-08-09 ENCOUNTER — Other Ambulatory Visit: Payer: Self-pay | Admitting: Cardiology

## 2021-09-01 ENCOUNTER — Other Ambulatory Visit: Payer: Self-pay | Admitting: Cardiology

## 2021-09-21 ENCOUNTER — Other Ambulatory Visit: Payer: Self-pay

## 2021-09-21 ENCOUNTER — Ambulatory Visit: Payer: PPO | Admitting: Cardiology

## 2021-09-21 ENCOUNTER — Encounter: Payer: Self-pay | Admitting: Cardiology

## 2021-09-21 VITALS — BP 144/90 | HR 43 | Ht 73.0 in | Wt 228.0 lb

## 2021-09-21 DIAGNOSIS — I10 Essential (primary) hypertension: Secondary | ICD-10-CM | POA: Diagnosis not present

## 2021-09-21 DIAGNOSIS — E782 Mixed hyperlipidemia: Secondary | ICD-10-CM | POA: Diagnosis not present

## 2021-09-21 DIAGNOSIS — R001 Bradycardia, unspecified: Secondary | ICD-10-CM | POA: Diagnosis not present

## 2021-09-21 DIAGNOSIS — I2510 Atherosclerotic heart disease of native coronary artery without angina pectoris: Secondary | ICD-10-CM | POA: Diagnosis not present

## 2021-09-21 DIAGNOSIS — G8929 Other chronic pain: Secondary | ICD-10-CM

## 2021-09-21 DIAGNOSIS — R519 Headache, unspecified: Secondary | ICD-10-CM

## 2021-09-21 NOTE — Progress Notes (Signed)
?Cardiology Office Note:   ? ?Date:  09/21/2021  ? ?ID:  Steven Kennedy, DOB [birth year], MRN [id number] ? ?PCP:  Maryella Shivers, MD  ?Cardiologist:  Shirlee More, MD   ? ?Referring MD: Maryella Shivers, MD  ? ? ?ASSESSMENT:   ? ?1. Coronary artery disease involving native coronary artery of native heart without angina pectoris   ?2. Essential hypertension   ?3. Mixed hyperlipidemia   ?4. Sinus bradycardia   ? ?PLAN:   ? ?In order of problems listed above: ? ?Steven Kennedy continues to do well with CAD New York Heart Association class I after PCI and current treatment including his aspirin and clopidogrel along with amlodipine and his high intensity statin. ?Continue current antihypertensives we will begin trending his blood pressure at home ?Continue statin check liver function lipid profile ?Asymptomatic continue to monitor if he is getting rest rates are less than 40 we will apply an event monitor ?Also having intermittent severe headache concern for stroke check carotid duplex and I encouraged him to buy a smart watch for atrial fibrillation detection ? ? ?Next appointment: 9 months ? ? ?Medication Adjustments/Labs and Tests Ordered: ?Current medicines are reviewed at length with the patient today.  Concerns regarding medicines are outlined above.  ?No orders of the defined types were placed in this encounter. ? ?No orders of the defined types were placed in this encounter. ? ? ?Chief Complaint  ?Patient presents with  ? Follow-up  ? Coronary Artery Disease  ? ? ?History of Present Illness:   ? ?Steven Kennedy is a 71 y.o. male with a hx of post exertional syncope CAD PCI and drug-eluting stent right coronary artery 06/29/2019 hypertension sinus bradycardia and dyslipidemia  last seen 08/23/2020. ? ?He continues to do well he is a very vigorous active man and has never had another syncopal episode shortness of breath chest pain palpitation or syncope.  He tolerates his statin without muscle pain or weakness is overdue  for labs and will do them today.  His wife will begin to trend his heart rate and blood pressure at home normal for him his rate of 45-50.  I have asked him to getting rates less than 40 at home to contact me.  He is asymptomatic I do not think he requires a monitor. ? ?Compliance with diet, lifestyle and medications: Yes ?Past Medical History:  ?Diagnosis Date  ? CAD (coronary artery disease)   ? a. abnormal nuc - cath 06/2019 s/p DES to dRCA with residual moderate disease in the proximal and mid RCA, proximal LCx and proximal RI. EF 55%.  ? Dyslipidemia   ? Hypertension   ? Sinus bradycardia   ? Skin cancer 2007  ? Syncope and collapse   ? ? ?Past Surgical History:  ?Procedure Laterality Date  ? CORONARY STENT INTERVENTION N/A 06/13/2019  ? Procedure: CORONARY STENT INTERVENTION;  Surgeon: Leonie Man, MD;  Location: [place] [facility];  Service: Cardiovascular;  Laterality: N/A;  ? LEFT HEART CATH AND CORONARY ANGIOGRAPHY N/A 06/13/2019  ? Procedure: LEFT HEART CATH AND CORONARY ANGIOGRAPHY;  Surgeon: Leonie Man, MD;  Location: [place] [facility];  Service: Cardiovascular;  Laterality: N/A;  ? SKIN CANCER EXCISION    ? TOTAL HIP ARTHROPLASTY    ? ? ?Current Medications: ?Current Meds  ?Medication Sig  ? amLODipine (NORVASC) 5 MG tablet TAKE 1 TABLET BY MOUTH EVERY DAY  ? amoxicillin (AMOXIL) 500 MG tablet Take 2,000 mg by mouth See admin instructions.  Before Dental procedures  ? aspirin EC 81 MG tablet Take 81 mg by mouth daily.  ? clopidogrel (PLAVIX) 75 MG tablet TAKE 1 TABLET BY MOUTH DAILY WITH BREAKFAST.  ? CRANBERRY EXTRACT PO Take 25,000 mg by mouth daily.  ? fluticasone (FLONASE) 50 MCG/ACT nasal spray Place 1 spray into both nostrils daily.  ? loratadine (CLARITIN) 10 MG tablet Take 10 mg by mouth daily as needed for allergies.  ? Magnesium Gluconate (MAGNESIUM 27 PO) Take 2 tablets by mouth daily.  ? Multiple Vitamin (MULTIVITAMIN) tablet Take 1 tablet by mouth daily.  ? Omega-3 Fatty Acids  (FISH OIL PO) Take 1 tablet by mouth daily.   ? rosuvastatin (CRESTOR) 20 MG tablet Take 1 tablet (20 mg total) by mouth daily. Patient needs appointment for further refills. 1 st attempt  ? Turmeric 500 MG CAPS Take 3,000 mg by mouth daily.  ? vitamin C (ASCORBIC ACID) 500 MG tablet Take 1,500 mg by mouth daily.  ? zinc gluconate 50 MG tablet Take 100 mg by mouth daily.  ?  ? ?Allergies:   Patient has no known allergies.  ? ?Social History  ? ?Socioeconomic History  ? Marital status: Married  ?  Spouse name: Not on file  ? Number of children: Not on file  ? Years of education: Not on file  ? Highest education level: Not on file  ?Occupational History  ? Not on file  ?Tobacco Use  ? Smoking status: Former  ?  Packs/day: 0.50  ?  Years: 25.00  ?  Pack years: 12.50  ?  Types: Cigarettes  ?  Quit date: 38  ?  Years since quitting: 41.2  ? Smokeless tobacco: Former  ?  Types: Chew  ?  Quit date: 53  ?Vaping Use  ? Vaping Use: Never used  ?Substance and Sexual Activity  ? Alcohol use: Not Currently  ? Drug use: Not Currently  ? Sexual activity: Not on file  ?Other Topics Concern  ? Not on file  ?Social History Narrative  ? Not on file  ? ?Social Determinants of Health  ? ?Financial Resource Strain: Not on file  ?Food Insecurity: Not on file  ?Transportation Needs: Not on file  ?Physical Activity: Not on file  ?Stress: Not on file  ?Social Connections: Not on file  ?  ? ?Family History: ?The patient's family history includes Dementia in his paternal grandmother; Heart attack in his father, mother, and paternal grandfather; Hyperlipidemia in his father, mother, sister, and sister; Hypertension in his father, mother, sister, and sister; Stroke in his father. ?ROS:   ?Please see the history of present illness.    ?All other systems reviewed and are negative. ? ?EKGs/Labs/Other Studies Reviewed:   ? ?The following studies were reviewed today: ? ?EKG:  EKG ordered today and personally reviewed.  The ekg ordered today  demonstrates sinus bradycardia 43 bpm first-degree AV block normal QRS morphology and T waves ? ?Recent Labs: ?No results found for requested labs within last 8760 hours.  ?Recent Lipid Panel ?   ?Component Value Date/Time  ? CHOL 102 02/10/2020 0828  ? TRIG 41 02/10/2020 0828  ? HDL 46 02/10/2020 0828  ? CHOLHDL 2.2 02/10/2020 0828  ? [place] 45 02/10/2020 0828  ? ? ?Physical Exam:   ? ?VS:  BP (!) 144/90 (BP Location: Right Arm)   Pulse (!) 43   Ht '6\' 1"'$  (1.854 m)   Wt 228 lb (103.4 kg)   SpO2 97%   BMI  30.08 kg/m?    ? ?Wt Readings from Last 3 Encounters:  ?09/21/21 228 lb (103.4 kg)  ?08/23/20 225 lb (102.1 kg)  ?02/10/20 222 lb (100.7 kg)  ?  ? ?GEN:  Well nourished, well developed in no acute distress ?HEENT: Normal ?NECK: No JVD; No carotid bruits ?LYMPHATICS: No lymphadenopathy ?CARDIAC: RRR, no murmurs, rubs, gallops ?RESPIRATORY:  Clear to auscultation without rales, wheezing or rhonchi  ?ABDOMEN: Soft, non-tender, non-distended ?MUSCULOSKELETAL:  No edema; No deformity  ?SKIN: Warm and dry ?NEUROLOGIC:  Alert and oriented x 3 ?PSYCHIATRIC:  Normal affect  ? ? ?Signed, ?Shirlee More, MD  ?09/21/2021 12:58 PM    ?[place]  ?

## 2021-09-21 NOTE — Patient Instructions (Addendum)
Medication Instructions:  ?Your physician recommends that you continue on your current medications as directed. Please refer to the Current Medication list given to you today. ? ?*If you need a refill on your cardiac medications before your next appointment, please call your pharmacy* ? ? ?Lab Work: ?Your physician recommends that you return for lab work in: Today for [place] and Lipid profile ? ?If you have labs (blood work) drawn today and your tests are completely normal, you will receive your results only by: ?MyChart Message (if you have MyChart) OR ?A paper copy in the mail ?If you have any lab test that is abnormal or we need to change your treatment, we will call you to review the results. ? ? ?Testing/Procedures: ?Your physician has requested that you have a carotid duplex. This test is an ultrasound of the carotid arteries in your neck. It looks at blood flow through these arteries that supply the brain with blood. Allow one hour for this exam. There are no restrictions or special instructions. ? ? ? ?Follow-Up: ?At [facility], you and your health needs are our priority.  As part of our continuing mission to provide you with exceptional heart care, we have created designated Provider Care Teams.  These Care Teams include your primary Cardiologist (physician) and Advanced Practice Providers (APPs -  Physician Assistants and Nurse Practitioners) who all work together to provide you with the care you need, when you need it. ? ?We recommend signing up for the patient portal called "MyChart".  Sign up information is provided on this After Visit Summary.  MyChart is used to connect with patients for Virtual Visits (Telemedicine).  Patients are able to view lab/test results, encounter notes, upcoming appointments, etc.  Non-urgent messages can be sent to your provider as well.   ?To learn more about what you can do with MyChart, go to [email or web address].   ? ?Your next appointment:   ?1 year(s) ? ?The format  for your next appointment:   ?In Person ? ?Provider:   ?Shirlee More, MD  ? ? ?Other Instructions ?Check BP and Heart Rate several times per week and record.  S ?end [place] message if Heart Rates drop below 40 beats per minute  ?

## 2021-09-22 ENCOUNTER — Telehealth: Payer: Self-pay | Admitting: Cardiology

## 2021-09-22 LAB — COMPREHENSIVE METABOLIC PANEL
ALT: 28 IU/L (ref 0–44)
AST: 27 IU/L (ref 0–40)
Albumin/Globulin Ratio: 2.3 — ABNORMAL HIGH (ref 1.2–2.2)
Albumin: 4.8 g/dL — ABNORMAL HIGH (ref 3.7–4.7)
Alkaline Phosphatase: 86 IU/L (ref 44–121)
BUN/Creatinine Ratio: 14 (ref 10–24)
BUN: 12 mg/dL (ref 8–27)
Bilirubin Total: 0.6 mg/dL (ref 0.0–1.2)
CO2: 23 mmol/L (ref 20–29)
Calcium: 9.4 mg/dL (ref 8.6–10.2)
Chloride: 102 mmol/L (ref 96–106)
Creatinine, Ser: 0.85 mg/dL (ref 0.76–1.27)
Globulin, Total: 2.1 g/dL (ref 1.5–4.5)
Glucose: 89 mg/dL (ref 70–99)
Potassium: 4.4 mmol/L (ref 3.5–5.2)
Sodium: 139 mmol/L (ref 134–144)
Total Protein: 6.9 g/dL (ref 6.0–8.5)
eGFR: 93 mL/min/{1.73_m2} (ref >59)

## 2021-09-22 LAB — LIPID PANEL
Chol/HDL Ratio: 2.5 ratio (ref 0.0–5.0)
Cholesterol, Total: 112 mg/dL (ref 100–199)
HDL: 45 mg/dL (ref >39)
LDL Chol Calc (NIH): 53 mg/dL (ref 0–99)
Triglycerides: 62 mg/dL (ref 0–149)
VLDL Cholesterol Cal: 14 mg/dL (ref 5–40)

## 2021-09-22 NOTE — Telephone Encounter (Signed)
Spoke with wife, Steven Kennedy (on [place]) and let her know the results of lipid panel and CMP were good and no changes need to be made. She was glad and verbalized understanding. ?

## 2021-09-22 NOTE — Telephone Encounter (Signed)
Patient's wife is returning call to discuss lab results. 

## 2021-09-25 ENCOUNTER — Other Ambulatory Visit: Payer: Self-pay | Admitting: Cardiology

## 2021-10-03 ENCOUNTER — Other Ambulatory Visit: Payer: Self-pay

## 2021-10-03 ENCOUNTER — Ambulatory Visit (INDEPENDENT_AMBULATORY_CARE_PROVIDER_SITE_OTHER): Payer: PPO

## 2021-10-03 DIAGNOSIS — R55 Syncope and collapse: Secondary | ICD-10-CM | POA: Diagnosis not present

## 2021-10-03 DIAGNOSIS — R519 Headache, unspecified: Secondary | ICD-10-CM

## 2021-10-03 DIAGNOSIS — I2510 Atherosclerotic heart disease of native coronary artery without angina pectoris: Secondary | ICD-10-CM

## 2021-10-03 DIAGNOSIS — Z0000 Encounter for general adult medical examination without abnormal findings: Secondary | ICD-10-CM | POA: Diagnosis not present

## 2021-10-03 DIAGNOSIS — Z136 Encounter for screening for cardiovascular disorders: Secondary | ICD-10-CM | POA: Diagnosis not present

## 2021-10-03 DIAGNOSIS — G8929 Other chronic pain: Secondary | ICD-10-CM | POA: Diagnosis not present

## 2021-10-03 DIAGNOSIS — Z6831 Body mass index (BMI) 31.0-31.9, adult: Secondary | ICD-10-CM | POA: Diagnosis not present

## 2021-10-03 DIAGNOSIS — I10 Essential (primary) hypertension: Secondary | ICD-10-CM | POA: Diagnosis not present

## 2021-10-03 DIAGNOSIS — R001 Bradycardia, unspecified: Secondary | ICD-10-CM

## 2021-10-03 DIAGNOSIS — H9193 Unspecified hearing loss, bilateral: Secondary | ICD-10-CM | POA: Diagnosis not present

## 2021-10-03 DIAGNOSIS — E669 Obesity, unspecified: Secondary | ICD-10-CM | POA: Diagnosis not present

## 2021-10-03 DIAGNOSIS — E782 Mixed hyperlipidemia: Secondary | ICD-10-CM | POA: Diagnosis not present

## 2021-10-03 DIAGNOSIS — Z139 Encounter for screening, unspecified: Secondary | ICD-10-CM | POA: Diagnosis not present

## 2021-10-03 DIAGNOSIS — Z1331 Encounter for screening for depression: Secondary | ICD-10-CM | POA: Diagnosis not present

## 2021-10-03 DIAGNOSIS — Z1339 Encounter for screening examination for other mental health and behavioral disorders: Secondary | ICD-10-CM | POA: Diagnosis not present

## 2021-10-08 ENCOUNTER — Other Ambulatory Visit: Payer: Self-pay | Admitting: Cardiology

## 2021-10-10 ENCOUNTER — Ambulatory Visit: Payer: PPO | Admitting: Cardiology

## 2021-10-12 ENCOUNTER — Telehealth: Payer: Self-pay | Admitting: Cardiology

## 2021-10-12 NOTE — Telephone Encounter (Signed)
Returning call for results. Please advise  

## 2021-10-12 NOTE — Telephone Encounter (Signed)
Wife Ava - On DPR - called to get results of carotid U/S. Advised we would call when MD reviews and gives a final comment.  ?

## 2021-12-19 DIAGNOSIS — H9313 Tinnitus, bilateral: Secondary | ICD-10-CM | POA: Diagnosis not present

## 2021-12-19 DIAGNOSIS — Z77122 Contact with and (suspected) exposure to noise: Secondary | ICD-10-CM | POA: Diagnosis not present

## 2021-12-19 DIAGNOSIS — H9193 Unspecified hearing loss, bilateral: Secondary | ICD-10-CM | POA: Diagnosis not present

## 2021-12-19 DIAGNOSIS — H9190 Unspecified hearing loss, unspecified ear: Secondary | ICD-10-CM | POA: Diagnosis not present

## 2021-12-19 DIAGNOSIS — H9042 Sensorineural hearing loss, unilateral, left ear, with unrestricted hearing on the contralateral side: Secondary | ICD-10-CM | POA: Diagnosis not present

## 2022-01-30 DIAGNOSIS — Z139 Encounter for screening, unspecified: Secondary | ICD-10-CM | POA: Diagnosis not present

## 2022-01-30 DIAGNOSIS — M47816 Spondylosis without myelopathy or radiculopathy, lumbar region: Secondary | ICD-10-CM | POA: Diagnosis not present

## 2022-01-30 DIAGNOSIS — M25552 Pain in left hip: Secondary | ICD-10-CM | POA: Diagnosis not present

## 2022-01-30 DIAGNOSIS — M1612 Unilateral primary osteoarthritis, left hip: Secondary | ICD-10-CM | POA: Diagnosis not present

## 2022-01-30 DIAGNOSIS — Z6831 Body mass index (BMI) 31.0-31.9, adult: Secondary | ICD-10-CM | POA: Diagnosis not present

## 2022-02-20 DIAGNOSIS — Z1339 Encounter for screening examination for other mental health and behavioral disorders: Secondary | ICD-10-CM | POA: Diagnosis not present

## 2022-02-20 DIAGNOSIS — Z139 Encounter for screening, unspecified: Secondary | ICD-10-CM | POA: Diagnosis not present

## 2022-02-20 DIAGNOSIS — Z1331 Encounter for screening for depression: Secondary | ICD-10-CM | POA: Diagnosis not present

## 2022-02-20 DIAGNOSIS — Z6831 Body mass index (BMI) 31.0-31.9, adult: Secondary | ICD-10-CM | POA: Diagnosis not present

## 2022-02-20 DIAGNOSIS — Z1389 Encounter for screening for other disorder: Secondary | ICD-10-CM | POA: Diagnosis not present

## 2022-02-20 DIAGNOSIS — M1612 Unilateral primary osteoarthritis, left hip: Secondary | ICD-10-CM | POA: Diagnosis not present

## 2022-02-20 DIAGNOSIS — Z720 Tobacco use: Secondary | ICD-10-CM | POA: Diagnosis not present

## 2022-02-20 DIAGNOSIS — Z0000 Encounter for general adult medical examination without abnormal findings: Secondary | ICD-10-CM | POA: Diagnosis not present

## 2022-03-14 DIAGNOSIS — M1612 Unilateral primary osteoarthritis, left hip: Secondary | ICD-10-CM | POA: Diagnosis not present

## 2022-03-15 ENCOUNTER — Telehealth: Payer: Self-pay

## 2022-03-15 NOTE — Telephone Encounter (Signed)
   Pre-operative Risk Assessment    Patient Name: Steven Kennedy  DOB: 13-Feb-1951 MRN: [id number]      Request for Surgical Clearance    Procedure:   Left total hip arthroplasty  Date of Surgery:  Clearance TBD                                 Surgeon:  Dr. Rod Can, MD Surgeon's Group or Practice Name:  [facility] Phone number:  [phone] Fax number:  [phone]   Type of Clearance Requested:   - Medical    Type of Anesthesia:  Spinal   Additional requests/questions:    SignedToni Arthurs   03/15/2022, 3:58 PM

## 2022-03-15 NOTE — Telephone Encounter (Signed)
Primary Cardiologist:Brian Bettina Gavia, MD   Preoperative team, please contact this patient and set up a phone call appointment for further preoperative risk assessment. Please obtain consent and complete medication review. Thank you for your help.   He underwent DES to RCA 06/2019 with moderate residual CAD in prox and mid RCA, prox LCx and prox RI. It would be reasonable to hold Plavix for 5 days prior to surgery if requested.    Emmaline Life, NP-C     03/15/2022, 4:16 PM [address] Office [phone] Fax [phone]

## 2022-03-17 ENCOUNTER — Telehealth: Payer: Self-pay | Admitting: *Deleted

## 2022-03-17 NOTE — Telephone Encounter (Signed)
DPR ok to s/w the pt's wife AVA who has set up a tele pre op appt for the pt. 03/24/22 @ 9 am. Med rec and consent are done.     Patient Consent for Virtual Visit        Steven Kennedy has provided verbal consent on 03/17/2022 for a virtual visit (video or telephone).   CONSENT FOR VIRTUAL VISIT FOR:  Steven Kennedy  By participating in this virtual visit I agree to the following:  I hereby voluntarily request, consent and authorize [facility] and its employed or contracted physicians, physician assistants, nurse practitioners or other licensed health care professionals (the Practitioner), to provide me with telemedicine health care services (the "Services") as deemed necessary by the treating Practitioner. I acknowledge and consent to receive the Services by the Practitioner via telemedicine. I understand that the telemedicine visit will involve communicating with the Practitioner through live audiovisual communication technology and the disclosure of certain medical information by electronic transmission. I acknowledge that I have been given the opportunity to request an in-person assessment or other available alternative prior to the telemedicine visit and am voluntarily participating in the telemedicine visit.  I understand that I have the right to withhold or withdraw my consent to the use of telemedicine in the course of my care at any time, without affecting my right to future care or treatment, and that the Practitioner or I may terminate the telemedicine visit at any time. I understand that I have the right to inspect all information obtained and/or recorded in the course of the telemedicine visit and may receive copies of available information for a reasonable fee.  I understand that some of the potential risks of receiving the Services via telemedicine include:  Delay or interruption in medical evaluation due to technological equipment failure or disruption; Information  transmitted may not be sufficient (e.g. poor resolution of images) to allow for appropriate medical decision making by the Practitioner; and/or  In rare instances, security protocols could fail, causing a breach of personal health information.  Furthermore, I acknowledge that it is my responsibility to provide information about my medical history, conditions and care that is complete and accurate to the best of my ability. I acknowledge that Practitioner's advice, recommendations, and/or decision may be based on factors not within their control, such as incomplete or inaccurate data provided by me or distortions of diagnostic images or specimens that may result from electronic transmissions. I understand that the practice of medicine is not an exact science and that Practitioner makes no warranties or guarantees regarding treatment outcomes. I acknowledge that a copy of this consent can be made available to me via my patient portal ([facility]), or I can request a printed copy by calling the office of [facility].    I understand that my insurance will be billed for this visit.   I have read or had this consent read to me. I understand the contents of this consent, which adequately explains the benefits and risks of the Services being provided via telemedicine.  I have been provided ample opportunity to ask questions regarding this consent and the Services and have had my questions answered to my satisfaction. I give my informed consent for the services to be provided through the use of telemedicine in my medical care

## 2022-03-17 NOTE — Telephone Encounter (Signed)
DPR ok to s/w the pt's wife AVA who has set up a tele pre op appt for the pt. 03/24/22 @ 9 am. Med rec and consent are done.

## 2022-03-22 NOTE — Progress Notes (Signed)
Virtual Visit via Telephone Note   Because of Steven Kennedy's co-morbid illnesses, he is at least at moderate risk for complications without adequate follow up.  This format is felt to be most appropriate for this patient at this time.  The patient did not have access to video technology/had technical difficulties with video requiring transitioning to audio format only (telephone).  All issues noted in this document were discussed and addressed.  No physical exam could be performed with this format.  Please refer to the patient's chart for his consent to telehealth for [facility].  Evaluation Performed:  Preoperative cardiovascular risk assessment _____________   Date:  03/24/2022   Patient ID:  [name], DOB 08-04-1950, MRN [id number] Patient Location:  Home Provider location:   Office  Primary Care Provider:  Maryella Shivers, MD Primary Cardiologist:  Shirlee More, MD  Chief Complaint / Patient Profile   71 y.o. y/o male with a h/o CAD s/p PCI and DES to RCA 06/2019, HTN, hyperlipidemia, sinus bradycardia who is pending left total hip arthroplasty and presents today for telephonic preoperative cardiovascular risk assessment.  Past Medical History    Past Medical History:  Diagnosis Date   CAD (coronary artery disease)    a. abnormal nuc - cath 06/2019 s/p DES to dRCA with residual moderate disease in the proximal and mid RCA, proximal LCx and proximal RI. EF 55%.   Dyslipidemia    Hypertension    Sinus bradycardia    Skin cancer 2007   Syncope and collapse    Past Surgical History:  Procedure Laterality Date   CORONARY STENT INTERVENTION N/A 06/13/2019   Procedure: CORONARY STENT INTERVENTION;  Surgeon: Leonie Man, MD;  Location: [place] [facility];  Service: Cardiovascular;  Laterality: N/A;   LEFT HEART CATH AND CORONARY ANGIOGRAPHY N/A 06/13/2019   Procedure: LEFT HEART CATH AND CORONARY ANGIOGRAPHY;  Surgeon: Leonie Man, MD;  Location: [facility];  Service: Cardiovascular;  Laterality: N/A;   SKIN CANCER EXCISION     TOTAL HIP ARTHROPLASTY      Allergies  No Known Allergies  History of Present Illness    Steven Kennedy is a 71 y.o. male who presents via audio/video conferencing for a telehealth visit today.  Pt was last seen in cardiology clinic on 09/21/21 by Dr. Bettina Gavia.  At that time Steven Kennedy was doing well.  The patient is now pending procedure as outlined above. Since his last visit, he denies chest pain, shortness of breath, lower extremity edema, fatigue, palpitations, melena, hematuria, hemoptysis, diaphoresis, weakness, presyncope, syncope, orthopnea, and PND.   Home Medications    Prior to Admission medications   Medication Sig Start Date End Date Taking? Authorizing Provider  amLODipine (NORVASC) 5 MG tablet TAKE 1 TABLET BY MOUTH EVERY DAY 10/10/21   Richardo Priest, MD  amoxicillin (AMOXIL) 500 MG tablet Take 2,000 mg by mouth See admin instructions. Before Dental procedures 03/12/19   [provider]  aspirin EC 81 MG tablet Take 81 mg by mouth daily.    [provider]  clopidogrel (PLAVIX) 75 MG tablet TAKE 1 TABLET BY MOUTH EVERY DAY WITH BREAKFAST 10/10/21   Richardo Priest, MD  CRANBERRY EXTRACT PO Take 25,000 mg by mouth daily.    [provider]  fluticasone (FLONASE) 50 MCG/ACT nasal spray Place 1 spray into both nostrils daily.    [provider]  loratadine (CLARITIN) 10 MG tablet Take 10  mg by mouth daily as needed for allergies.    [provider]  Magnesium Gluconate (MAGNESIUM 27 PO) Take 2 tablets by mouth daily.    [provider]  Multiple Vitamin (MULTIVITAMIN) tablet Take 1 tablet by mouth daily.    [provider]  Omega-3 Fatty Acids (FISH OIL PO) Take 1 tablet by mouth daily.     [provider]  rosuvastatin (CRESTOR) 20 MG tablet Take 1 tablet (20 mg total) by mouth daily. 09/26/21   Richardo Priest, MD   Turmeric 500 MG CAPS Take 3,000 mg by mouth daily.    [provider]  vitamin C (ASCORBIC ACID) 500 MG tablet Take 1,500 mg by mouth daily.    [provider]  zinc gluconate 50 MG tablet Take 100 mg by mouth daily.    [provider]    Physical Exam    Vital Signs:  AIDYNN KRENN does not have vital signs available for review today.  Given telephonic nature of communication, physical exam is limited. AAOx3. NAD. Normal affect.  Speech and respirations are unlabored.  Accessory Clinical Findings    None  Assessment & Plan    1.  Preoperative Cardiovascular Risk Assessment: The patient is doing well from a cardiac perspective. Therefore, based on ACC/AHA guidelines, the patient would be at acceptable risk for the planned procedure without further cardiovascular testing. The patient was advised that if he develops new symptoms prior to surgery to contact our office to arrange for a follow-up visit, and he verbalized understanding. According to the Revised Cardiac Risk Index (RCRI), his Perioperative Risk of Major Cardiac Event is (%): 6.6. His Functional Capacity in METs is: 6.61 according to the Duke Activity Status Index (DASI).  The patient was advised that if he develops new symptoms prior to surgery to contact our office to arrange for a follow-up visit, and he verbalized understanding.  He underwent DES to RCA 06/2019 with moderate residual CAD in prox and mid RCA, prox LCx and prox RI. It would be reasonable to hold Plavix for 5 days prior to surgery if requested  A copy of this note will be routed to requesting surgeon.  Time:   Today, I have spent 10 minutes with the patient with telehealth technology discussing medical history, symptoms, and management plan.     Emmaline Life, NP-C   03/24/2022, 9:01 AM [address] Office [phone] Fax [phone]

## 2022-03-24 ENCOUNTER — Ambulatory Visit: Payer: PPO | Attending: Cardiovascular Disease | Admitting: Nurse Practitioner

## 2022-03-24 ENCOUNTER — Encounter: Payer: Self-pay | Admitting: Nurse Practitioner

## 2022-03-24 DIAGNOSIS — Z01810 Encounter for preprocedural cardiovascular examination: Secondary | ICD-10-CM

## 2022-04-11 ENCOUNTER — Other Ambulatory Visit: Payer: Self-pay | Admitting: Cardiology

## 2022-04-12 DIAGNOSIS — Z6831 Body mass index (BMI) 31.0-31.9, adult: Secondary | ICD-10-CM | POA: Diagnosis not present

## 2022-04-12 DIAGNOSIS — Z139 Encounter for screening, unspecified: Secondary | ICD-10-CM | POA: Diagnosis not present

## 2022-04-12 DIAGNOSIS — Z131 Encounter for screening for diabetes mellitus: Secondary | ICD-10-CM | POA: Diagnosis not present

## 2022-04-12 DIAGNOSIS — Z01818 Encounter for other preprocedural examination: Secondary | ICD-10-CM | POA: Diagnosis not present

## 2022-04-12 DIAGNOSIS — I2510 Atherosclerotic heart disease of native coronary artery without angina pectoris: Secondary | ICD-10-CM | POA: Diagnosis not present

## 2022-04-12 DIAGNOSIS — I10 Essential (primary) hypertension: Secondary | ICD-10-CM | POA: Diagnosis not present

## 2022-04-25 ENCOUNTER — Ambulatory Visit: Payer: Self-pay | Admitting: Student

## 2022-05-15 ENCOUNTER — Ambulatory Visit: Payer: Self-pay | Admitting: Student

## 2022-05-15 NOTE — Patient Instructions (Signed)
SURGICAL WAITING ROOM VISITATION Patients having surgery or a procedure may have no more than 2 support people in the waiting area - these visitors may rotate.   Children under the age of 54 must have an adult with them who is not the patient. If the patient needs to stay at the hospital during part of their recovery, the visitor guidelines for inpatient rooms apply. Pre-op nurse will coordinate an appropriate time for 1 support person to accompany patient in pre-op.  This support person may not rotate.    Please refer to the [facility] website for the visitor guidelines for Inpatients (after your surgery is over and you are in a regular room).    Your procedure is scheduled on: 05/25/22   Report to [facility] Main Entrance    Report to admitting at 5:15 AM   Call this number if you have problems the morning of surgery [phone]   Do not eat food :After Midnight.   After Midnight you may have the following liquids until 4:30 AM/ PM DAY OF SURGERY  Water Non-Citrus Juices (without pulp, NO RED) Carbonated Beverages Black Coffee (NO MILK/CREAM OR CREAMERS, sugar ok)  Clear Tea (NO MILK/CREAM OR CREAMERS, sugar ok) regular and decaf                             Plain Jell-O (NO RED)                                           Fruit ices (not with fruit pulp, NO RED)                                     Popsicles (NO RED)                                                               Sports drinks like Gatorade (NO RED)               The day of surgery:  Drink ONE (1) Pre-Surgery Clear Ensure at 4:30 AM the morning of surgery. Drink in one sitting. Do not sip.  This drink was given to you during your hospital  pre-op appointment visit. Nothing else to drink after completing the  Pre-Surgery Clear Ensure.          If you have questions, please contact your surgeon's office.   FOLLOW BOWEL PREP AND ANY ADDITIONAL PRE OP INSTRUCTIONS YOU RECEIVED FROM YOUR SURGEON'S  OFFICE!!!     Oral Hygiene is also important to reduce your risk of infection.                                    Remember - BRUSH YOUR TEETH THE MORNING OF SURGERY WITH YOUR REGULAR TOOTHPASTE   Do NOT smoke after Midnight   Take these medicines the morning of surgery with A SIP OF WATER: Amlodipine, Claritin, Rosuvastatin   DO NOT TAKE ANY ORAL DIABETIC MEDICATIONS DAY OF  YOUR SURGERY  Bring CPAP mask and tubing day of surgery.                              You may not have any metal on your body including jewelry, and body piercing             Do not wear lotions, powders, cologne, or deodorant              Men may shave face and neck.   Do not bring valuables to the hospital. [place].   Contacts, dentures or bridgework may not be worn into surgery.  DO NOT [place]. PHARMACY WILL DISPENSE MEDICATIONS LISTED ON YOUR MEDICATION LIST TO YOU DURING YOUR ADMISSION [place]!    Patients discharged on the day of surgery will not be allowed to drive home.  Someone NEEDS to stay with you for the first 24 hours after anesthesia.   Special Instructions: Bring a copy of your healthcare power of attorney and living will documents the day of surgery if you haven't scanned them before.              Please read over the following fact sheets you were given: IF [place] [phone] Schneiders    If you received a COVID test during your pre-op visit  it is requested that you wear a mask when out in public, stay away from anyone that may not be feeling well and notify your surgeon if you develop symptoms. If you test positive for Covid or have been in contact with anyone that has tested positive in the last 10 days please notify you surgeon.    [place] - Preparing for Surgery Before surgery, you can play an important role.  Because skin is not  sterile, your skin needs to be as free of germs as possible.  You can reduce the number of germs on your skin by washing with CHG (chlorahexidine gluconate) soap before surgery.  CHG is an antiseptic cleaner which kills germs and bonds with the skin to continue killing germs even after washing. Please DO NOT use if you have an allergy to CHG or antibacterial soaps.  If your skin becomes reddened/irritated stop using the CHG and inform your nurse when you arrive at Short Stay. Do not shave (including legs and underarms) for at least 48 hours prior to the first CHG shower.  You may shave your face/neck.  Please follow these instructions carefully:  1.  Shower with CHG Soap the night before surgery and the  morning of surgery.  2.  If you choose to wash your hair, wash your hair first as usual with your normal  shampoo.  3.  After you shampoo, rinse your hair and body thoroughly to remove the shampoo.                             4.  Use CHG as you would any other liquid soap.  You can apply chg directly to the skin and wash.  Gently with a scrungie or clean washcloth.  5.  Apply the CHG Soap to your body ONLY FROM THE NECK DOWN.   Do   not use on  face/ open                           Wound or open sores. Avoid contact with eyes, ears mouth and   genitals (private parts).                       Wash face,  Genitals (private parts) with your normal soap.             6.  Wash thoroughly, paying special attention to the area where your    surgery  will be performed.  7.  Thoroughly rinse your body with warm water from the neck down.  8.  DO NOT shower/wash with your normal soap after using and rinsing off the CHG Soap.                9.  Pat yourself dry with a clean towel.            10.  Wear clean pajamas.            11.  Place clean sheets on your bed the night of your first shower and do not  sleep with pets. Day of Surgery : Do not apply any lotions/deodorants the morning of surgery.  Please wear  clean clothes to the hospital/surgery center.  FAILURE TO FOLLOW THESE INSTRUCTIONS MAY RESULT IN THE CANCELLATION OF YOUR SURGERY  PATIENT SIGNATURE_________________________________  NURSE SIGNATURE__________________________________  ________________________________________________________________________  Adam Phenix  An incentive spirometer is a tool that can help keep your lungs clear and active. This tool measures how well you are filling your lungs with each breath. Taking long deep breaths may help reverse or decrease the chance of developing breathing (pulmonary) problems (especially infection) following: A long period of time when you are unable to move or be active. BEFORE THE PROCEDURE  If the spirometer includes an indicator to show your best effort, your nurse or respiratory therapist will set it to a desired goal. If possible, sit up straight or lean slightly forward. Try not to slouch. Hold the incentive spirometer in an upright position. INSTRUCTIONS FOR USE  Sit on the edge of your bed if possible, or sit up as far as you can in bed or on a chair. Hold the incentive spirometer in an upright position. Breathe out normally. Place the mouthpiece in your mouth and seal your lips tightly around it. Breathe in slowly and as deeply as possible, raising the piston or the ball toward the top of the column. Hold your breath for 3-5 seconds or for as long as possible. Allow the piston or ball to fall to the bottom of the column. Remove the mouthpiece from your mouth and breathe out normally. Rest for a few seconds and repeat Steps 1 through 7 at least 10 times every 1-2 hours when you are awake. Take your time and take a few normal breaths between deep breaths. The spirometer may include an indicator to show your best effort. Use the indicator as a goal to work toward during each repetition. After each set of 10 deep breaths, practice coughing to be sure your lungs are  clear. If you have an incision (the cut made at the time of surgery), support your incision when coughing by placing a pillow or rolled up towels firmly against it. Once you are able to get out of bed, walk around indoors and cough well. You may stop using the incentive  spirometer when instructed by your caregiver.  RISKS AND COMPLICATIONS Take your time so you do not get dizzy or light-headed. If you are in pain, you may need to take or ask for pain medication before doing incentive spirometry. It is harder to take a deep breath if you are having pain. AFTER USE Rest and breathe slowly and easily. It can be helpful to keep track of a log of your progress. Your caregiver can provide you with a simple table to help with this. If you are using the spirometer at home, follow these instructions: [place] IF:  You are having difficultly using the spirometer. You have trouble using the spirometer as often as instructed. Your pain medication is not giving enough relief while using the spirometer. You develop fever of 100.5 F (38.1 C) or higher. SEEK IMMEDIATE MEDICAL CARE IF:  You cough up bloody sputum that had not been present before. You develop fever of 102 F (38.9 C) or greater. You develop worsening pain at or near the incision site. MAKE SURE YOU:  Understand these instructions. Will watch your condition. Will get help right away if you are not doing well or get worse. Document Released: 11/06/2006 Document Revised: 09/18/2011 Document Reviewed: 01/07/2007 ExitCare Patient Information 2014 ExitCare, [place].   ________________________________________________________________________ WHAT IS A BLOOD TRANSFUSION? Blood Transfusion Information  A transfusion is the replacement of blood or some of its parts. Blood is made up of multiple cells which provide different functions. Red blood cells carry oxygen and are used for blood loss replacement. White blood cells fight against  infection. Platelets control bleeding. Plasma helps clot blood. Other blood products are available for specialized needs, such as hemophilia or other clotting disorders. BEFORE THE TRANSFUSION  Who gives blood for transfusions?  Healthy volunteers who are fully evaluated to make sure their blood is safe. This is blood bank blood. Transfusion therapy is the safest it has ever been in the practice of medicine. Before blood is taken from a donor, a complete history is taken to make sure that person has no history of diseases nor engages in risky social behavior (examples are intravenous drug use or sexual activity with multiple partners). The donor's travel history is screened to minimize risk of transmitting infections, such as malaria. The donated blood is tested for signs of infectious diseases, such as HIV and hepatitis. The blood is then tested to be sure it is compatible with you in order to minimize the chance of a transfusion reaction. If you or a relative donates blood, this is often done in anticipation of surgery and is not appropriate for emergency situations. It takes many days to process the donated blood. RISKS AND COMPLICATIONS Although transfusion therapy is very safe and saves many lives, the main dangers of transfusion include:  Getting an infectious disease. Developing a transfusion reaction. This is an allergic reaction to something in the blood you were given. Every precaution is taken to prevent this. The decision to have a blood transfusion has been considered carefully by your caregiver before blood is given. Blood is not given unless the benefits outweigh the risks. AFTER THE TRANSFUSION Right after receiving a blood transfusion, you will usually feel much better and more energetic. This is especially true if your red blood cells have gotten low (anemic). The transfusion raises the level of the red blood cells which carry oxygen, and this usually causes an energy increase. The  nurse administering the transfusion will monitor you carefully for complications. HOME  CARE INSTRUCTIONS  No special instructions are needed after a transfusion. You may find your energy is better. Speak with your caregiver about any limitations on activity for underlying diseases you may have. SEEK MEDICAL CARE IF:  Your condition is not improving after your transfusion. You develop redness or irritation at the intravenous (IV) site. SEEK IMMEDIATE MEDICAL CARE IF:  Any of the following symptoms occur over the next 12 hours: Shaking chills. You have a temperature by mouth above 102 F (38.9 C), not controlled by medicine. Chest, back, or muscle pain. People around you feel you are not acting correctly or are confused. Shortness of breath or difficulty breathing. Dizziness and fainting. You get a rash or develop hives. You have a decrease in urine output. Your urine turns a dark color or changes to pink, red, or brown. Any of the following symptoms occur over the next 10 days: You have a temperature by mouth above 102 F (38.9 C), not controlled by medicine. Shortness of breath. Weakness after normal activity. The white part of the eye turns yellow (jaundice). You have a decrease in the amount of urine or are urinating less often. Your urine turns a dark color or changes to pink, red, or brown. Document Released: 06/23/2000 Document Revised: 09/18/2011 Document Reviewed: 02/10/2008 [facility] Patient Information 2014 [place], [place].  _______________________________________________________________________

## 2022-05-15 NOTE — Progress Notes (Addendum)
COVID Vaccine Completed:  Date of COVID positive in last 90 days:  PCP - Maryella Shivers, MD Cardiologist - Shirlee More, MD  Cardiac clearance by Christen Bame 03/24/22 in [place] clearance by Maryella Shivers 04/12/22 on chart  Chest x-ray -  EKG - 09/21/21 Epic Stress Test - 05/29/19 Epic ECHO -  Cardiac Cath - 06/13/19 Epic Pacemaker/ICD device last checked: Spinal Cord Stimulator:  Bowel Prep -   Sleep Study -  CPAP -   Fasting Blood Sugar -  Checks Blood Sugar _____ times a day  Last dose of GLP1 agonist-  N/A GLP1 instructions:  N/A   Last dose of SGLT-2 inhibitors-  N/A SGLT-2 instructions: N/A   Blood Thinner Instructions: Plavix, hold 5 days Aspirin Instructions: ASA 81 Last Dose:  Activity level:  Can go up a flight of stairs and perform activities of daily living without stopping and without symptoms of chest pain or shortness of breath.  Able to exercise without symptoms  Unable to go up a flight of stairs without symptoms of     Anesthesia review: CAD, HTN, 1st degree AV block  Patient denies shortness of breath, fever, cough and chest pain at PAT appointment  Patient verbalized understanding of instructions that were given to them at the PAT appointment. Patient was also instructed that they will need to review over the PAT instructions again at home before surgery.

## 2022-05-16 ENCOUNTER — Encounter (HOSPITAL_COMMUNITY): Payer: Self-pay

## 2022-05-16 ENCOUNTER — Encounter (HOSPITAL_COMMUNITY)
Admission: RE | Admit: 2022-05-16 | Discharge: 2022-05-16 | Disposition: A | Discharge status: Home / Self Care | Payer: PPO | Source: Ambulatory Visit | Attending: Orthopedic Surgery | Admitting: Orthopedic Surgery

## 2022-05-16 VITALS — BP 156/93 | HR 52 | Temp 97.7°F | Resp 18 | Ht 73.0 in | Wt 222.0 lb

## 2022-05-16 DIAGNOSIS — Z79899 Other long term (current) drug therapy: Secondary | ICD-10-CM | POA: Insufficient documentation

## 2022-05-16 DIAGNOSIS — I2510 Atherosclerotic heart disease of native coronary artery without angina pectoris: Secondary | ICD-10-CM | POA: Diagnosis not present

## 2022-05-16 DIAGNOSIS — M1612 Unilateral primary osteoarthritis, left hip: Secondary | ICD-10-CM | POA: Insufficient documentation

## 2022-05-16 DIAGNOSIS — Z955 Presence of coronary angioplasty implant and graft: Secondary | ICD-10-CM | POA: Insufficient documentation

## 2022-05-16 DIAGNOSIS — Z01812 Encounter for preprocedural laboratory examination: Secondary | ICD-10-CM | POA: Insufficient documentation

## 2022-05-16 DIAGNOSIS — I10 Essential (primary) hypertension: Secondary | ICD-10-CM | POA: Diagnosis not present

## 2022-05-16 DIAGNOSIS — R001 Bradycardia, unspecified: Secondary | ICD-10-CM | POA: Diagnosis not present

## 2022-05-16 DIAGNOSIS — Z01818 Encounter for other preprocedural examination: Secondary | ICD-10-CM

## 2022-05-16 HISTORY — DX: Personal history of urinary calculi: Z87.442

## 2022-05-16 HISTORY — DX: Unspecified osteoarthritis, unspecified site: M19.90

## 2022-05-16 LAB — BASIC METABOLIC PANEL
Anion gap: 9 (ref 5–15)
BUN: 10 mg/dL (ref 8–23)
CO2: 25 mmol/L (ref 22–32)
Calcium: 9.2 mg/dL (ref 8.9–10.3)
Chloride: 108 mmol/L (ref 98–111)
Creatinine, Ser: 0.87 mg/dL (ref 0.61–1.24)
GFR, Estimated: >60 mL/min (ref >60)
Glucose, Bld: 91 mg/dL (ref 70–99)
Potassium: 4.6 mmol/L (ref 3.5–5.1)
Sodium: 142 mmol/L (ref 135–145)

## 2022-05-16 LAB — CBC
HCT: 45.6 % (ref 39.0–52.0)
Hemoglobin: 15.5 g/dL (ref 13.0–17.0)
MCH: 35.8 pg — ABNORMAL HIGH (ref 26.0–34.0)
MCHC: 34 g/dL (ref 30.0–36.0)
MCV: 105.3 fL — ABNORMAL HIGH (ref 80.0–100.0)
Platelets: 200 10*3/uL (ref 150–400)
RBC: 4.33 MIL/uL (ref 4.22–5.81)
RDW: 12.5 % (ref 11.5–15.5)
WBC: 5.5 10*3/uL (ref 4.0–10.5)
nRBC: 0 % (ref 0.0–0.2)

## 2022-05-16 LAB — SURGICAL PCR SCREEN
MRSA, PCR: NEGATIVE
Staphylococcus aureus: POSITIVE — AB

## 2022-05-16 NOTE — Progress Notes (Signed)
[facility]+ results routed to Dr. Lyla Glassing

## 2022-05-17 NOTE — Progress Notes (Signed)
Anesthesia Chart Review   Case: [id number] Date/Time: 05/25/22 0715   Procedure: TOTAL HIP ARTHROPLASTY ANTERIOR APPROACH (Left: Hip) - 150   Anesthesia type: Spinal   Pre-op diagnosis: Left hip osteoarthritis   Location: [facility] ROOM 08 / [facility]   Surgeons: Rod Can, MD       DISCUSSION:71 y.o. former smoker with h/o HTN, CAD (DES 2020), sinus bradycardia, left hip OA scheduled for above procedure 05/25/2022 with Dr. Rod Can.   Pt seen by cardiology 03/24/2022 for preoperative evaluation 03/24/2022. Per OV note, "Preoperative Cardiovascular Risk Assessment: The patient is doing well from a cardiac perspective. Therefore, based on ACC/AHA guidelines, the patient would be at acceptable risk for the planned procedure without further cardiovascular testing. The patient was advised that if he develops new symptoms prior to surgery to contact our office to arrange for a follow-up visit, and he verbalized understanding. According to the Revised Cardiac Risk Index (RCRI), his Perioperative Risk of Major Cardiac Event is (%): 6.6. His Functional Capacity in METs is: 6.61 according to the Duke Activity Status Index (DASI).   The patient was advised that if he develops new symptoms prior to surgery to contact our office to arrange for a follow-up visit, and he verbalized understanding.   He underwent DES to RCA 06/2019 with moderate residual CAD in prox and mid RCA, prox LCx and prox RI. It would be reasonable to hold Plavix for 5 days prior to surgery if requested"  Pt advised to hold Plavix 5 days prior to procedure.   Anticipate pt can proceed with planned procedure barring acute status change.   VS: BP (!) 156/93   Pulse (!) 52 Comment: pt reports this is normal for him  Temp 36.5 C (Oral)   Resp 18   Ht '6\' 1"'$  (1.854 m)   Wt 100.7 kg   SpO2 100%   BMI 29.29 kg/m   PROVIDERS: Maryella Shivers, MD is PCP   Cardiologist - Shirlee More, MD  LABS: Labs reviewed: Acceptable for  surgery. (all labs ordered are listed, but only abnormal results are displayed)  Labs Reviewed  SURGICAL PCR SCREEN - Abnormal; Notable for the following components:      Result Value   Staphylococcus aureus POSITIVE (*)    All other components within normal limits  CBC - Abnormal; Notable for the following components:   MCV 105.3 (*)    MCH 35.8 (*)    All other components within normal limits  BASIC METABOLIC PANEL  TYPE AND SCREEN     IMAGES:   EKG:   CV:  Past Medical History:  Diagnosis Date   Arthritis    CAD (coronary artery disease)    a. abnormal nuc - cath 06/2019 s/p DES to dRCA with residual moderate disease in the proximal and mid RCA, proximal LCx and proximal RI. EF 55%.   Dyslipidemia    History of kidney stones    Hypertension    Sinus bradycardia    Skin cancer 2007   Syncope and collapse     Past Surgical History:  Procedure Laterality Date   CORONARY STENT INTERVENTION N/A 06/13/2019   Procedure: CORONARY STENT INTERVENTION;  Surgeon: Leonie Man, MD;  Location: [facility];  Service: Cardiovascular;  Laterality: N/A;   LEFT HEART CATH AND CORONARY ANGIOGRAPHY N/A 06/13/2019   Procedure: LEFT HEART CATH AND CORONARY ANGIOGRAPHY;  Surgeon: Leonie Man, MD;  Location: [facility];  Service: Cardiovascular;  Laterality: N/A;  ROTATOR CUFF REPAIR Right    SKIN CANCER EXCISION     TOTAL HIP ARTHROPLASTY Right     MEDICATIONS:  amLODipine (NORVASC) 5 MG tablet   amoxicillin (AMOXIL) 500 MG tablet   aspirin EC 81 MG tablet   clopidogrel (PLAVIX) 75 MG tablet   CRANBERRY EXTRACT PO   fluticasone (FLONASE) 50 MCG/ACT nasal spray   loratadine (CLARITIN) 10 MG tablet   Magnesium Gluconate (MAGNESIUM 27 PO)   Multiple Vitamin (MULTIVITAMIN) tablet   Omega-3 Fatty Acids (FISH OIL PO)   rosuvastatin (CRESTOR) 20 MG tablet   Turmeric 500 MG CAPS   vitamin C (ASCORBIC ACID) 500 MG tablet   zinc gluconate 50 MG tablet   No  current facility-administered medications for this encounter.    Steven Felix Ward, PA-C [facility]Surgical Testing [phone]

## 2022-05-17 NOTE — Anesthesia Preprocedure Evaluation (Addendum)
Anesthesia Evaluation  Patient identified by MRN, date of birth, ID band Patient awake    Reviewed: Allergy & Precautions, NPO status , Patient's Chart, lab work & pertinent test results  History of Anesthesia Complications Negative for: history of anesthetic complications  Airway Mallampati: II  TM Distance: >3 FB Neck ROM: Full    Dental  (+) Dental Advisory Given   Pulmonary former smoker   Pulmonary exam normal        Cardiovascular METS: 5 - 7 Mets hypertension, Pt. on medications + CAD and + Cardiac Stents (06/2019)  Normal cardiovascular exam     Neuro/Psych negative neurological ROS     GI/Hepatic negative GI ROS, Neg liver ROS,,,  Endo/Other  negative endocrine ROS    Renal/GU negative Renal ROS  negative genitourinary   Musculoskeletal  (+) Arthritis , Osteoarthritis,    Abdominal   Peds  Hematology negative hematology ROS (+)   Anesthesia Other Findings PLAVIX  Reproductive/Obstetrics                             Anesthesia Physical Anesthesia Plan  ASA: 3  Anesthesia Plan: General   Post-op Pain Management: Tylenol PO (pre-op)* and Toradol IV (intra-op)*   Induction: Intravenous  PONV Risk Score and Plan: 2 and Ondansetron, Dexamethasone, Treatment may vary due to age or medical condition and Midazolam  Airway Management Planned: Oral ETT  Additional Equipment: None  Intra-op Plan:   Post-operative Plan: Extubation in OR  Informed Consent: I have reviewed the patients History and Physical, chart, labs and discussed the procedure including the risks, benefits and alternatives for the proposed anesthesia with the patient or authorized representative who has indicated his/her understanding and acceptance.     Dental advisory given  Plan Discussed with:   Anesthesia Plan Comments: (See PAT note 05/16/2022)       Anesthesia Quick Evaluation

## 2022-05-23 ENCOUNTER — Ambulatory Visit: Payer: Self-pay | Admitting: Student

## 2022-05-23 NOTE — H&P (Signed)
TOTAL HIP ADMISSION H&P  Patient is admitted for left total hip arthroplasty.  Subjective:  Chief Complaint: left hip pain  HPI: Steven Kennedy, 71 y.o. male, has a history of pain and functional disability in the left hip(s) due to arthritis and patient has failed non-surgical conservative treatments for greater than 12 weeks to include NSAID's and/or analgesics, flexibility and strengthening excercises, use of assistive devices, and activity modification.  Onset of symptoms was gradual starting 10 years ago with rapidlly worsening course since that time.The patient noted no past surgery on the left hip(s).  Patient currently rates pain in the left hip at 10 out of 10 with activity. Patient has night pain, worsening of pain with activity and weight bearing, trendelenberg gait, pain that interfers with activities of daily living, and pain with passive range of motion. Patient has evidence of subchondral cysts, subchondral sclerosis, periarticular osteophytes, and joint space narrowing by imaging studies. This condition presents safety issues increasing the risk of falls.  There is no current active infection.  Patient Active Problem List   Diagnosis Date Noted   Hypertension    Dyslipidemia    CAD (coronary artery disease)    Abnormal nuclear stress test 06/13/2019   Syncope and collapse 06/13/2019   CAD in native artery 06/13/2019   Essential hypertension 06/13/2019   Hyperlipidemia 06/13/2019   Sinus bradycardia 06/13/2019   Pre-op evaluation 06/04/2019   Skin cancer 2007   Past Medical History:  Diagnosis Date   Arthritis    CAD (coronary artery disease)    a. abnormal nuc - cath 06/2019 s/p DES to dRCA with residual moderate disease in the proximal and mid RCA, proximal LCx and proximal RI. EF 55%.   Dyslipidemia    History of kidney stones    Hypertension    Sinus bradycardia    Skin cancer 2007   Syncope and collapse     Past Surgical History:  Procedure Laterality Date    CORONARY STENT INTERVENTION N/A 06/13/2019   Procedure: CORONARY STENT INTERVENTION;  Surgeon: Leonie Man, MD;  Location: [facility];  Service: Cardiovascular;  Laterality: N/A;   LEFT HEART CATH AND CORONARY ANGIOGRAPHY N/A 06/13/2019   Procedure: LEFT HEART CATH AND CORONARY ANGIOGRAPHY;  Surgeon: Leonie Man, MD;  Location: [facility];  Service: Cardiovascular;  Laterality: N/A;   ROTATOR CUFF REPAIR Right    SKIN CANCER EXCISION     TOTAL HIP ARTHROPLASTY Right     Current Outpatient Medications  Medication Sig Dispense Refill Last Dose   amLODipine (NORVASC) 5 MG tablet TAKE 1 TABLET BY MOUTH EVERY DAY 90 tablet 2    amoxicillin (AMOXIL) 500 MG tablet Take 2,000 mg by mouth See admin instructions. Take 2000 mg by mouth prior dental visits      aspirin EC 81 MG tablet Take 81 mg by mouth daily.      clopidogrel (PLAVIX) 75 MG tablet TAKE 1 TABLET BY MOUTH EVERY DAY WITH BREAKFAST 90 tablet 1    CRANBERRY EXTRACT PO Take 25,000 mg by mouth daily.      fluticasone (FLONASE) 50 MCG/ACT nasal spray Place 1 spray into both nostrils daily as needed for allergies.      loratadine (CLARITIN) 10 MG tablet Take 10 mg by mouth daily as needed for allergies.      Magnesium Gluconate (MAGNESIUM 27 PO) Take 2 tablets by mouth daily.      Multiple Vitamin (MULTIVITAMIN) tablet Take 1 tablet by  mouth daily.      Omega-3 Fatty Acids (FISH OIL PO) Take 1 capsule by mouth daily.      rosuvastatin (CRESTOR) 20 MG tablet Take 1 tablet (20 mg total) by mouth daily. 90 tablet 3    Turmeric 500 MG CAPS Take 3,000 mg by mouth daily.      vitamin C (ASCORBIC ACID) 500 MG tablet Take 1,500 mg by mouth daily.      zinc gluconate 50 MG tablet Take 100 mg by mouth daily.      No current facility-administered medications for this visit.   No Known Allergies  Social History   Tobacco Use   Smoking status: Former    Packs/day: 0.50    Years: 25.00    Total pack years: 12.50    Types:  Cigarettes    Quit date: 83    Years since quitting: 41.8   Smokeless tobacco: Former    Types: Chew    Quit date: 1982  Substance Use Topics   Alcohol use: Not Currently    Family History  Problem Relation Age of Onset   Heart attack Mother    Hyperlipidemia Mother    Hypertension Mother    Heart attack Father    Stroke Father    Hypertension Father    Hyperlipidemia Father    Hypertension Sister    Hyperlipidemia Sister    Dementia Paternal Grandmother    Heart attack Paternal Grandfather    Hypertension Sister    Hyperlipidemia Sister      Review of Systems  Musculoskeletal:  Positive for arthralgias and gait problem.  All other systems reviewed and are negative.   Objective:  Physical Exam Constitutional:      Appearance: Normal appearance.  HENT:     Head: Normocephalic and atraumatic.     Nose: Nose normal.     Mouth/Throat:     Mouth: Mucous membranes are moist.     Pharynx: Oropharynx is clear.  Eyes:     Conjunctiva/sclera: Conjunctivae normal.  Cardiovascular:     Rate and Rhythm: Normal rate and regular rhythm.     Pulses: Normal pulses.     Heart sounds: Normal heart sounds.  Pulmonary:     Effort: Pulmonary effort is normal.     Breath sounds: Normal breath sounds.  Abdominal:     General: Abdomen is flat.     Palpations: Abdomen is soft.  Genitourinary:    Comments: deferred Musculoskeletal:     Cervical back: Normal range of motion and neck supple.     Comments: Examination of the left hip reveals no skin wounds or lesions. No trochanteric tenderness to palpation. He has severely restricted range of motion of the left hip. He has a 20 degree flexion contracture, and I can flex him up to 80 degrees. He internally rotates 0 degrees, and externally rotates 25 degrees. Pain with terminal flexion and rotation.   Neurovascular intact distally.  Skin:    General: Skin is warm and dry.     Capillary Refill: Capillary refill takes less than 2  seconds.  Neurological:     General: No focal deficit present.     Mental Status: He is alert and oriented to person, place, and time.  Psychiatric:        Mood and Affect: Mood normal.        Behavior: Behavior normal.        Thought Content: Thought content normal.  Judgment: Judgment normal.     Vital signs in last 24 hours: '@VSRANGES'$ @  Labs:   Estimated body mass index is 29.29 kg/m as calculated from the following:   Height as of 05/16/22: '6\' 1"'$  (1.854 m).   Weight as of 05/16/22: 100.7 kg.   Imaging Review Plain radiographs demonstrate severe degenerative joint disease of the left hip(s). The bone quality appears to be adequate for age and reported activity level.      Assessment/Plan:  End stage arthritis, left hip(s)  The patient history, physical examination, clinical judgement of the provider and imaging studies are consistent with end stage degenerative joint disease of the left hip(s) and total hip arthroplasty is deemed medically necessary. The treatment options including medical management, injection therapy, arthroscopy and arthroplasty were discussed at length. The risks and benefits of total hip arthroplasty were presented and reviewed. The risks due to aseptic loosening, infection, stiffness, dislocation/subluxation,  thromboembolic complications and other imponderables were discussed.  The patient acknowledged the explanation, agreed to proceed with the plan and consent was signed. Patient is being admitted for inpatient treatment for surgery, pain control, PT, OT, prophylactic antibiotics, VTE prophylaxis, progressive ambulation and ADL's and discharge planning.The patient is planning to be discharged home with HEP.   Therapy Plans: HEP. Disposition: Home with wife Planned DVT Prophylaxis: aspirin '81mg'$  BID and plavix at baseline.  DME needed: Has rolling walker.  PCP: Cleared Cardiology: Cleared TXA: IV Allergies: NDKA.  Anesthesia Concerns: None.   BMI: 29.2 Last HgbA1c: 5.1 Other: - CAD s/p PCI, ASA and plavix baseline. Hold 5 days prior to surgery.  - History of Rt THA, posterior. Femur fracture with Rt THA.  - Hydrocodone, zofran.  - NO NSAIDs.  - 05/16/22: Hgb 15.5, Cr.  0.87, K+ 4.6.   Patient's anticipated LOS is less than 2 midnights, meeting these requirements: - Younger than 22 - Lives within 1 hour of care - Has a competent adult at home to recover with post-op recover - NO history of  - Chronic pain requiring opiods  - Diabetes  - Coronary Artery Disease  - Heart failure  - Heart attack  - Stroke  - DVT/VTE  - Cardiac arrhythmia  - Respiratory Failure/COPD  - Renal failure  - Anemia  - Advanced Liver disease

## 2022-05-23 NOTE — H&P (View-Only) (Signed)
TOTAL HIP ADMISSION H&P  Patient is admitted for left total hip arthroplasty.  Subjective:  Chief Complaint: left hip pain  HPI: Steven Kennedy, 71 y.o. male, has a history of pain and functional disability in the left hip(s) due to arthritis and patient has failed non-surgical conservative treatments for greater than 12 weeks to include NSAID's and/or analgesics, flexibility and strengthening excercises, use of assistive devices, and activity modification.  Onset of symptoms was gradual starting 10 years ago with rapidlly worsening course since that time.The patient noted no past surgery on the left hip(s).  Patient currently rates pain in the left hip at 10 out of 10 with activity. Patient has night pain, worsening of pain with activity and weight bearing, trendelenberg gait, pain that interfers with activities of daily living, and pain with passive range of motion. Patient has evidence of subchondral cysts, subchondral sclerosis, periarticular osteophytes, and joint space narrowing by imaging studies. This condition presents safety issues increasing the risk of falls.  There is no current active infection.  Patient Active Problem List   Diagnosis Date Noted   Hypertension    Dyslipidemia    CAD (coronary artery disease)    Abnormal nuclear stress test 06/13/2019   Syncope and collapse 06/13/2019   CAD in native artery 06/13/2019   Essential hypertension 06/13/2019   Hyperlipidemia 06/13/2019   Sinus bradycardia 06/13/2019   Pre-op evaluation 06/04/2019   Skin cancer 2007   Past Medical History:  Diagnosis Date   Arthritis    CAD (coronary artery disease)    a. abnormal nuc - cath 06/2019 s/p DES to dRCA with residual moderate disease in the proximal and mid RCA, proximal LCx and proximal RI. EF 55%.   Dyslipidemia    History of kidney stones    Hypertension    Sinus bradycardia    Skin cancer 2007   Syncope and collapse     Past Surgical History:  Procedure Laterality Date    CORONARY STENT INTERVENTION N/A 06/13/2019   Procedure: CORONARY STENT INTERVENTION;  Surgeon: Leonie Man, MD;  Location: [place] [place] [facility];  Service: Cardiovascular;  Laterality: N/A;   LEFT HEART CATH AND CORONARY ANGIOGRAPHY N/A 06/13/2019   Procedure: LEFT HEART CATH AND CORONARY ANGIOGRAPHY;  Surgeon: Leonie Man, MD;  Location: [facility];  Service: Cardiovascular;  Laterality: N/A;   ROTATOR CUFF REPAIR Right    SKIN CANCER EXCISION     TOTAL HIP ARTHROPLASTY Right     Current Outpatient Medications  Medication Sig Dispense Refill Last Dose   amLODipine (NORVASC) 5 MG tablet TAKE 1 TABLET BY MOUTH EVERY DAY 90 tablet 2    amoxicillin (AMOXIL) 500 MG tablet Take 2,000 mg by mouth See admin instructions. Take 2000 mg by mouth prior dental visits      aspirin EC 81 MG tablet Take 81 mg by mouth daily.      clopidogrel (PLAVIX) 75 MG tablet TAKE 1 TABLET BY MOUTH EVERY DAY WITH BREAKFAST 90 tablet 1    CRANBERRY EXTRACT PO Take 25,000 mg by mouth daily.      fluticasone (FLONASE) 50 MCG/ACT nasal spray Place 1 spray into both nostrils daily as needed for allergies.      loratadine (CLARITIN) 10 MG tablet Take 10 mg by mouth daily as needed for allergies.      Magnesium Gluconate (MAGNESIUM 27 PO) Take 2 tablets by mouth daily.      Multiple Vitamin (MULTIVITAMIN) tablet Take 1 tablet by  mouth daily.      Omega-3 Fatty Acids (FISH OIL PO) Take 1 capsule by mouth daily.      rosuvastatin (CRESTOR) 20 MG tablet Take 1 tablet (20 mg total) by mouth daily. 90 tablet 3    Turmeric 500 MG CAPS Take 3,000 mg by mouth daily.      vitamin C (ASCORBIC ACID) 500 MG tablet Take 1,500 mg by mouth daily.      zinc gluconate 50 MG tablet Take 100 mg by mouth daily.      No current facility-administered medications for this visit.   No Known Allergies  Social History   Tobacco Use   Smoking status: Former    Packs/day: 0.50    Years: 25.00    Total pack years: 12.50    Types:  Cigarettes    Quit date: 6    Years since quitting: 41.8   Smokeless tobacco: Former    Types: Chew    Quit date: 1982  Substance Use Topics   Alcohol use: Not Currently    Family History  Problem Relation Age of Onset   Heart attack Mother    Hyperlipidemia Mother    Hypertension Mother    Heart attack Father    Stroke Father    Hypertension Father    Hyperlipidemia Father    Hypertension Sister    Hyperlipidemia Sister    Dementia Paternal Grandmother    Heart attack Paternal Grandfather    Hypertension Sister    Hyperlipidemia Sister      Review of Systems  Musculoskeletal:  Positive for arthralgias and gait problem.  All other systems reviewed and are negative.   Objective:  Physical Exam Constitutional:      Appearance: Normal appearance.  HENT:     Head: Normocephalic and atraumatic.     Nose: Nose normal.     Mouth/Throat:     Mouth: Mucous membranes are moist.     Pharynx: Oropharynx is clear.  Eyes:     Conjunctiva/sclera: Conjunctivae normal.  Cardiovascular:     Rate and Rhythm: Normal rate and regular rhythm.     Pulses: Normal pulses.     Heart sounds: Normal heart sounds.  Pulmonary:     Effort: Pulmonary effort is normal.     Breath sounds: Normal breath sounds.  Abdominal:     General: Abdomen is flat.     Palpations: Abdomen is soft.  Genitourinary:    Comments: deferred Musculoskeletal:     Cervical back: Normal range of motion and neck supple.     Comments: Examination of the left hip reveals no skin wounds or lesions. No trochanteric tenderness to palpation. He has severely restricted range of motion of the left hip. He has a 20 degree flexion contracture, and I can flex him up to 80 degrees. He internally rotates 0 degrees, and externally rotates 25 degrees. Pain with terminal flexion and rotation.   Neurovascular intact distally.  Skin:    General: Skin is warm and dry.     Capillary Refill: Capillary refill takes less than 2  seconds.  Neurological:     General: No focal deficit present.     Mental Status: He is alert and oriented to person, place, and time.  Psychiatric:        Mood and Affect: Mood normal.        Behavior: Behavior normal.        Thought Content: Thought content normal.  Judgment: Judgment normal.     Vital signs in last 24 hours: '@VSRANGES'$ @  Labs:   Estimated body mass index is 29.29 kg/m as calculated from the following:   Height as of 05/16/22: '6\' 1"'$  (1.854 m).   Weight as of 05/16/22: 100.7 kg.   Imaging Review Plain radiographs demonstrate severe degenerative joint disease of the left hip(s). The bone quality appears to be adequate for age and reported activity level.      Assessment/Plan:  End stage arthritis, left hip(s)  The patient history, physical examination, clinical judgement of the provider and imaging studies are consistent with end stage degenerative joint disease of the left hip(s) and total hip arthroplasty is deemed medically necessary. The treatment options including medical management, injection therapy, arthroscopy and arthroplasty were discussed at length. The risks and benefits of total hip arthroplasty were presented and reviewed. The risks due to aseptic loosening, infection, stiffness, dislocation/subluxation,  thromboembolic complications and other imponderables were discussed.  The patient acknowledged the explanation, agreed to proceed with the plan and consent was signed. Patient is being admitted for inpatient treatment for surgery, pain control, PT, OT, prophylactic antibiotics, VTE prophylaxis, progressive ambulation and ADL's and discharge planning.The patient is planning to be discharged home with HEP.   Therapy Plans: HEP. Disposition: Home with wife Planned DVT Prophylaxis: aspirin '81mg'$  BID and plavix at baseline.  DME needed: Has rolling walker.  PCP: Cleared Cardiology: Cleared TXA: IV Allergies: NDKA.  Anesthesia Concerns: None.   BMI: 29.2 Last HgbA1c: 5.1 Other: - CAD s/p PCI, ASA and plavix baseline. Hold 5 days prior to surgery.  - History of Rt THA, posterior. Femur fracture with Rt THA.  - Hydrocodone, zofran.  - NO NSAIDs.  - 05/16/22: Hgb 15.5, Cr.  0.87, K+ 4.6.   Patient's anticipated LOS is less than 2 midnights, meeting these requirements: - Younger than 69 - Lives within 1 hour of care - Has a competent adult at home to recover with post-op recover - NO history of  - Chronic pain requiring opiods  - Diabetes  - Coronary Artery Disease  - Heart failure  - Heart attack  - Stroke  - DVT/VTE  - Cardiac arrhythmia  - Respiratory Failure/COPD  - Renal failure  - Anemia  - Advanced Liver disease

## 2022-05-25 ENCOUNTER — Ambulatory Visit (HOSPITAL_COMMUNITY): Payer: PPO

## 2022-05-25 ENCOUNTER — Other Ambulatory Visit: Payer: Self-pay

## 2022-05-25 ENCOUNTER — Encounter (HOSPITAL_COMMUNITY)
Admission: RE | Disposition: A | Discharge status: Home / Self Care | Payer: Self-pay | Source: Ambulatory Visit | Attending: Orthopedic Surgery

## 2022-05-25 ENCOUNTER — Ambulatory Visit (HOSPITAL_COMMUNITY)
Admission: RE | Admit: 2022-05-25 | Discharge: 2022-05-25 | Disposition: A | Discharge status: Home / Self Care | Payer: PPO | Source: Ambulatory Visit | Attending: Orthopedic Surgery | Admitting: Orthopedic Surgery

## 2022-05-25 ENCOUNTER — Encounter (HOSPITAL_COMMUNITY): Payer: Self-pay | Admitting: Orthopedic Surgery

## 2022-05-25 ENCOUNTER — Ambulatory Visit (HOSPITAL_BASED_OUTPATIENT_CLINIC_OR_DEPARTMENT_OTHER): Payer: PPO | Admitting: Anesthesiology

## 2022-05-25 ENCOUNTER — Ambulatory Visit (HOSPITAL_COMMUNITY): Payer: PPO | Admitting: Physician Assistant

## 2022-05-25 DIAGNOSIS — I2510 Atherosclerotic heart disease of native coronary artery without angina pectoris: Secondary | ICD-10-CM | POA: Insufficient documentation

## 2022-05-25 DIAGNOSIS — Z79899 Other long term (current) drug therapy: Secondary | ICD-10-CM | POA: Diagnosis not present

## 2022-05-25 DIAGNOSIS — M1612 Unilateral primary osteoarthritis, left hip: Secondary | ICD-10-CM | POA: Insufficient documentation

## 2022-05-25 DIAGNOSIS — I10 Essential (primary) hypertension: Secondary | ICD-10-CM | POA: Diagnosis not present

## 2022-05-25 DIAGNOSIS — Z87891 Personal history of nicotine dependence: Secondary | ICD-10-CM | POA: Insufficient documentation

## 2022-05-25 DIAGNOSIS — Z96642 Presence of left artificial hip joint: Secondary | ICD-10-CM | POA: Diagnosis not present

## 2022-05-25 DIAGNOSIS — Z955 Presence of coronary angioplasty implant and graft: Secondary | ICD-10-CM | POA: Insufficient documentation

## 2022-05-25 DIAGNOSIS — Z471 Aftercare following joint replacement surgery: Secondary | ICD-10-CM | POA: Diagnosis not present

## 2022-05-25 HISTORY — PX: TOTAL HIP ARTHROPLASTY: SHX124

## 2022-05-25 HISTORY — DX: Unilateral primary osteoarthritis, left hip: M16.12

## 2022-05-25 LAB — ABO/RH: ABO/RH(D): O POS

## 2022-05-25 LAB — TYPE AND SCREEN
ABO/RH(D): O POS
Antibody Screen: NEGATIVE

## 2022-05-25 SURGERY — ARTHROPLASTY, HIP, TOTAL, ANTERIOR APPROACH
Anesthesia: General | Site: Hip | Laterality: Left

## 2022-05-25 MED ORDER — KETOROLAC TROMETHAMINE 30 MG/ML IJ SOLN
INTRAMUSCULAR | Status: AC
  Filled 2022-05-25: qty 1

## 2022-05-25 MED ORDER — MORPHINE SULFATE (PF) 2 MG/ML IV SOLN: 0.5000 mg | INTRAVENOUS | Status: DC | PRN

## 2022-05-25 MED ORDER — OXYCODONE HCL 5 MG/5ML PO SOLN: 5.0000 mg | Freq: Once | ORAL | Status: DC | PRN

## 2022-05-25 MED ORDER — SENNA 8.6 MG PO TABS: 2.0000 | ORAL_TABLET | Freq: Every day | ORAL | 1 refills | Status: AC

## 2022-05-25 MED ORDER — FENTANYL CITRATE PF 50 MCG/ML IJ SOSY: 25.0000 ug | PREFILLED_SYRINGE | INTRAMUSCULAR | Status: DC | PRN

## 2022-05-25 MED ORDER — METHOCARBAMOL 500 MG PO TABS: 500.0000 mg | ORAL_TABLET | Freq: Four times a day (QID) | ORAL | Status: DC | PRN

## 2022-05-25 MED ORDER — SODIUM CHLORIDE (PF) 0.9 % IJ SOLN
INTRAMUSCULAR | Status: DC | PRN
  Administered 2022-05-25: 30 mL

## 2022-05-25 MED ORDER — BUPIVACAINE-EPINEPHRINE (PF) 0.5% -1:200000 IJ SOLN
INTRAMUSCULAR | Status: AC
  Filled 2022-05-25: qty 30

## 2022-05-25 MED ORDER — SODIUM CHLORIDE (PF) 0.9 % IJ SOLN
INTRAMUSCULAR | Status: AC
  Filled 2022-05-25: qty 30

## 2022-05-25 MED ORDER — ASPIRIN 81 MG PO CHEW: 81.0000 mg | CHEWABLE_TABLET | Freq: Two times a day (BID) | ORAL | 0 refills | Status: AC

## 2022-05-25 MED ORDER — CEFAZOLIN SODIUM-DEXTROSE 2-4 GM/100ML-% IV SOLN
2.0000 g | Freq: Four times a day (QID) | INTRAVENOUS | Status: DC
  Administered 2022-05-25: 2 g via INTRAVENOUS

## 2022-05-25 MED ORDER — AMISULPRIDE (ANTIEMETIC) 5 MG/2ML IV SOLN: 10.0000 mg | Freq: Once | INTRAVENOUS | Status: DC | PRN

## 2022-05-25 MED ORDER — POVIDONE-IODINE 10 % EX SWAB
2.0000 | Freq: Once | CUTANEOUS | Status: AC
  Administered 2022-05-25: 2 via TOPICAL

## 2022-05-25 MED ORDER — POLYETHYLENE GLYCOL 3350 17 G PO PACK: 17.0000 g | PACK | Freq: Every day | ORAL | 0 refills | Status: DC | PRN

## 2022-05-25 MED ORDER — HYDROCODONE-ACETAMINOPHEN 5-325 MG PO TABS: 1.0000 | ORAL_TABLET | ORAL | Status: DC | PRN

## 2022-05-25 MED ORDER — BUPIVACAINE-EPINEPHRINE (PF) 0.5% -1:200000 IJ SOLN
INTRAMUSCULAR | Status: DC | PRN
  Administered 2022-05-25: 30 mL

## 2022-05-25 MED ORDER — ONDANSETRON HCL 4 MG/2ML IJ SOLN: 4.0000 mg | Freq: Four times a day (QID) | INTRAMUSCULAR | Status: DC | PRN

## 2022-05-25 MED ORDER — DEXAMETHASONE SODIUM PHOSPHATE 10 MG/ML IJ SOLN
INTRAMUSCULAR | Status: DC | PRN
  Administered 2022-05-25: 10 mg via INTRAVENOUS

## 2022-05-25 MED ORDER — CEFAZOLIN SODIUM-DEXTROSE 2-4 GM/100ML-% IV SOLN
2.0000 g | INTRAVENOUS | Status: AC
  Administered 2022-05-25: 2 g via INTRAVENOUS
  Filled 2022-05-25: qty 100

## 2022-05-25 MED ORDER — SODIUM CHLORIDE 0.9 % IR SOLN
Status: DC | PRN
  Administered 2022-05-25: 1000 mL
  Administered 2022-05-25: 3000 mL

## 2022-05-25 MED ORDER — KETOROLAC TROMETHAMINE 30 MG/ML IJ SOLN
INTRAMUSCULAR | Status: DC | PRN
  Administered 2022-05-25: 30 mg
  Administered 2022-05-25: 30 mg via INTRAMUSCULAR

## 2022-05-25 MED ORDER — LACTATED RINGERS IV SOLN: INTRAVENOUS | Status: DC

## 2022-05-25 MED ORDER — ACETAMINOPHEN 500 MG PO TABS
1000.0000 mg | ORAL_TABLET | Freq: Once | ORAL | Status: AC
  Administered 2022-05-25: 1000 mg via ORAL
  Filled 2022-05-25: qty 2

## 2022-05-25 MED ORDER — ONDANSETRON HCL 4 MG/2ML IJ SOLN: 4.0000 mg | Freq: Once | INTRAMUSCULAR | Status: DC | PRN

## 2022-05-25 MED ORDER — METHOCARBAMOL 500 MG IVPB - SIMPLE MED: 500.0000 mg | Freq: Four times a day (QID) | INTRAVENOUS | Status: DC | PRN

## 2022-05-25 MED ORDER — OXYCODONE HCL 5 MG PO TABS: 5.0000 mg | ORAL_TABLET | Freq: Once | ORAL | Status: DC | PRN

## 2022-05-25 MED ORDER — ACETAMINOPHEN 10 MG/ML IV SOLN
INTRAVENOUS | Status: AC
  Administered 2022-05-25: 1000 mg
  Filled 2022-05-25: qty 100

## 2022-05-25 MED ORDER — METOCLOPRAMIDE HCL 5 MG PO TABS: 5.0000 mg | ORAL_TABLET | Freq: Three times a day (TID) | ORAL | Status: DC | PRN

## 2022-05-25 MED ORDER — METOCLOPRAMIDE HCL 5 MG/ML IJ SOLN: 5.0000 mg | Freq: Three times a day (TID) | INTRAMUSCULAR | Status: DC | PRN

## 2022-05-25 MED ORDER — CEFAZOLIN SODIUM-DEXTROSE 2-4 GM/100ML-% IV SOLN
INTRAVENOUS | Status: AC
  Filled 2022-05-25: qty 100

## 2022-05-25 MED ORDER — ACETAMINOPHEN 325 MG PO TABS: 325.0000 mg | ORAL_TABLET | Freq: Four times a day (QID) | ORAL | Status: DC | PRN

## 2022-05-25 MED ORDER — FENTANYL CITRATE (PF) 100 MCG/2ML IJ SOLN
INTRAMUSCULAR | Status: DC | PRN
  Administered 2022-05-25: 100 ug via INTRAVENOUS

## 2022-05-25 MED ORDER — CHLORHEXIDINE GLUCONATE 0.12 % MT SOLN
15.0000 mL | Freq: Once | OROMUCOSAL | Status: AC
  Administered 2022-05-25: 15 mL via OROMUCOSAL

## 2022-05-25 MED ORDER — ISOPROPYL ALCOHOL 70 % SOLN
Status: AC
  Filled 2022-05-25: qty 480

## 2022-05-25 MED ORDER — LACTATED RINGERS IV BOLUS
500.0000 mL | Freq: Once | INTRAVENOUS | Status: AC
  Administered 2022-05-25: 500 mL via INTRAVENOUS

## 2022-05-25 MED ORDER — ONDANSETRON HCL 4 MG PO TABS: 4.0000 mg | ORAL_TABLET | Freq: Four times a day (QID) | ORAL | Status: DC | PRN

## 2022-05-25 MED ORDER — ONDANSETRON HCL 4 MG PO TABS: 4.0000 mg | ORAL_TABLET | Freq: Three times a day (TID) | ORAL | 0 refills | Status: DC | PRN

## 2022-05-25 MED ORDER — ORAL CARE MOUTH RINSE: 15.0000 mL | Freq: Once | OROMUCOSAL | Status: AC

## 2022-05-25 MED ORDER — EPHEDRINE SULFATE (PRESSORS) 50 MG/ML IJ SOLN
INTRAMUSCULAR | Status: DC | PRN
  Administered 2022-05-25: 5 mg via INTRAVENOUS

## 2022-05-25 MED ORDER — HYDROCODONE-ACETAMINOPHEN 7.5-325 MG PO TABS: 1.0000 | ORAL_TABLET | ORAL | Status: DC | PRN

## 2022-05-25 MED ORDER — POVIDONE-IODINE 10 % EX SWAB: 2.0000 | Freq: Once | CUTANEOUS | Status: DC

## 2022-05-25 MED ORDER — ACETAMINOPHEN 10 MG/ML IV SOLN: 1000.0000 mg | Freq: Once | INTRAVENOUS | Status: AC

## 2022-05-25 MED ORDER — PROPOFOL 500 MG/50ML IV EMUL
INTRAVENOUS | Status: DC | PRN
  Administered 2022-05-25: 75 ug/kg/min via INTRAVENOUS

## 2022-05-25 MED ORDER — FENTANYL CITRATE (PF) 100 MCG/2ML IJ SOLN
INTRAMUSCULAR | Status: AC
  Filled 2022-05-25: qty 2

## 2022-05-25 MED ORDER — KETOROLAC TROMETHAMINE 30 MG/ML IJ SOLN: 30.0000 mg | Freq: Once | INTRAMUSCULAR | Status: DC

## 2022-05-25 MED ORDER — ONDANSETRON HCL 4 MG/2ML IJ SOLN
INTRAMUSCULAR | Status: DC | PRN
  Administered 2022-05-25: 4 mg via INTRAVENOUS

## 2022-05-25 MED ORDER — ACETAMINOPHEN 500 MG PO TABS: 1000.0000 mg | ORAL_TABLET | Freq: Once | ORAL | Status: DC

## 2022-05-25 MED ORDER — TRANEXAMIC ACID-NACL 1000-0.7 MG/100ML-% IV SOLN
1000.0000 mg | INTRAVENOUS | Status: AC
  Administered 2022-05-25: 1000 mg via INTRAVENOUS
  Filled 2022-05-25: qty 100

## 2022-05-25 MED ORDER — DOCUSATE SODIUM 100 MG PO CAPS: 100.0000 mg | ORAL_CAPSULE | Freq: Two times a day (BID) | ORAL | 1 refills | Status: AC

## 2022-05-25 MED ORDER — ISOPROPYL ALCOHOL 70 % SOLN
Status: DC | PRN
  Administered 2022-05-25: 1 via TOPICAL

## 2022-05-25 MED ORDER — HYDROCODONE-ACETAMINOPHEN 5-325 MG PO TABS: 1.0000 | ORAL_TABLET | ORAL | 0 refills | Status: DC | PRN

## 2022-05-25 MED ORDER — LACTATED RINGERS IV BOLUS
250.0000 mL | Freq: Once | INTRAVENOUS | Status: AC
  Administered 2022-05-25: 250 mL via INTRAVENOUS

## 2022-05-25 MED ORDER — EPHEDRINE 5 MG/ML INJ
INTRAVENOUS | Status: AC
  Filled 2022-05-25: qty 5

## 2022-05-25 SURGICAL SUPPLY — 52 items
BAG COUNTER SPONGE SURGICOUNT (BAG) IMPLANT
BAG DECANTER FOR FLEXI CONT (MISCELLANEOUS) IMPLANT
BAG ZIPLOCK 12X15 (MISCELLANEOUS) IMPLANT
CHLORAPREP W/TINT 26 (MISCELLANEOUS) ×1 IMPLANT
COVER PERINEAL POST (MISCELLANEOUS) ×1 IMPLANT
COVER SURGICAL LIGHT HANDLE (MISCELLANEOUS) ×1 IMPLANT
DERMABOND ADVANCED .7 DNX12 (GAUZE/BANDAGES/DRESSINGS) ×2 IMPLANT
DRAPE IMP U-DRAPE 54X76 (DRAPES) ×1 IMPLANT
DRAPE SHEET LG 3/4 BI-LAMINATE (DRAPES) ×3 IMPLANT
DRAPE STERI IOBAN 125X83 (DRAPES) ×1 IMPLANT
DRAPE U-SHAPE 47X51 STRL (DRAPES) ×2 IMPLANT
DRSG AQUACEL AG ADV 3.5X10 (GAUZE/BANDAGES/DRESSINGS) ×1 IMPLANT
ELECT REM PT RETURN 15FT ADLT (MISCELLANEOUS) ×1 IMPLANT
G7 VIT E NTRL LNR 36 SZG (Miscellaneous) IMPLANT
GAUZE SPONGE 4X4 12PLY STRL (GAUZE/BANDAGES/DRESSINGS) ×1 IMPLANT
GLOVE BIO SURGEON STRL SZ8.5 (GLOVE) ×2 IMPLANT
GLOVE BIOGEL M 7.0 STRL (GLOVE) ×1 IMPLANT
GLOVE BIOGEL PI IND STRL 7.5 (GLOVE) ×1 IMPLANT
GLOVE BIOGEL PI IND STRL 8.5 (GLOVE) ×1 IMPLANT
GOWN SPEC L3 XXLG W/TWL (GOWN DISPOSABLE) ×1 IMPLANT
GOWN STRL REUS W/ TWL XL LVL3 (GOWN DISPOSABLE) ×1 IMPLANT
GOWN STRL REUS W/TWL XL LVL3 (GOWN DISPOSABLE) ×1
HANDPIECE INTERPULSE COAX TIP (DISPOSABLE) ×1
HEAD CERAMIC BIOLOX 36 T1 STD (Head) IMPLANT
HOLDER FOLEY CATH W/STRAP (MISCELLANEOUS) ×1 IMPLANT
HOOD PEEL AWAY T7 (MISCELLANEOUS) ×3 IMPLANT
KIT TURNOVER KIT A (KITS) IMPLANT
MANIFOLD NEPTUNE II (INSTRUMENTS) ×1 IMPLANT
MARKER SKIN DUAL TIP RULER LAB (MISCELLANEOUS) ×1 IMPLANT
NDL SAFETY ECLIP 18X1.5 (MISCELLANEOUS) ×1 IMPLANT
NDL SPNL 18GX3.5 QUINCKE PK (NEEDLE) ×1 IMPLANT
NEEDLE SPNL 18GX3.5 QUINCKE PK (NEEDLE) ×1 IMPLANT
PACK ANTERIOR HIP CUSTOM (KITS) ×1 IMPLANT
PENCIL SMOKE EVACUATOR (MISCELLANEOUS) IMPLANT
SAW OSC TIP CART 19.5X105X1.3 (SAW) ×1 IMPLANT
SEALER BIPOLAR AQUA 6.0 (INSTRUMENTS) ×1 IMPLANT
SET HNDPC FAN SPRY TIP SCT (DISPOSABLE) ×1 IMPLANT
SHELL ACET G7 4H 58 SZG HIP (Shell) IMPLANT
SOLUTION PRONTOSAN WOUND 350ML (IRRIGATION / IRRIGATOR) ×1 IMPLANT
SPIKE FLUID TRANSFER (MISCELLANEOUS) ×1 IMPLANT
STEM FEM CMTLS 15X115 133D (Stem) IMPLANT
SUT MNCRL AB 3-0 PS2 18 (SUTURE) ×1 IMPLANT
SUT MON AB 2-0 CT1 36 (SUTURE) ×1 IMPLANT
SUT STRATAFIX PDO 1 14 VIOLET (SUTURE) ×1
SUT STRATFX PDO 1 14 VIOLET (SUTURE) ×1
SUT VIC AB 2-0 CT1 27 (SUTURE)
SUT VIC AB 2-0 CT1 TAPERPNT 27 (SUTURE) IMPLANT
SUTURE STRATFX PDO 1 14 VIOLET (SUTURE) ×1 IMPLANT
SYR 3ML LL SCALE MARK (SYRINGE) ×1 IMPLANT
TRAY FOLEY MTR SLVR 16FR STAT (SET/KITS/TRAYS/PACK) IMPLANT
TUBE SUCTION HIGH CAP CLEAR NV (SUCTIONS) ×1 IMPLANT
WATER STERILE IRR 1000ML POUR (IV SOLUTION) ×1 IMPLANT

## 2022-05-25 NOTE — Op Note (Signed)
OPERATIVE REPORT  SURGEON: Rod Can, MD   ASSISTANT: Larene Pickett, PA-C.  PREOPERATIVE DIAGNOSIS: Left hip arthritis.   POSTOPERATIVE DIAGNOSIS: Left hip arthritis.   PROCEDURE: Left total hip arthroplasty, anterior approach.   IMPLANTS: Biomet Taperloc Complete Microplasty stem, size 15 x 115 mm, high offset. Biomet G7 OsseoTi Cup, size 58 mm. Biomet Vivacit-E liner, size 36 mm, G, neutral. Biomet Biolox ceramic head ball, size 36 + 0 mm.  ANESTHESIA:  MAC and Spinal  ESTIMATED BLOOD LOSS:-400 mL    ANTIBIOTICS: 2g Ancef.  DRAINS: None.  COMPLICATIONS: None.   CONDITION: PACU - hemodynamically stable.   BRIEF CLINICAL NOTE: Steven Kennedy is a 71 y.o. male with a long-standing history of Left hip arthritis. After failing conservative management, the patient was indicated for total hip arthroplasty. The risks, benefits, and alternatives to the procedure were explained, and the patient elected to proceed.  PROCEDURE IN DETAIL: Surgical site was marked by myself in the pre-op holding area. Once inside the operating room, spinal anesthesia was obtained, and a foley catheter was inserted. The patient was then positioned on the Hana table.  All bony prominences were well padded.  The hip was prepped and draped in the normal sterile surgical fashion.  A time-out was called verifying side and site of surgery. The patient received IV antibiotics within 60 minutes of beginning the procedure.   Bikini incision was made, and superficial dissection was performed lateral to the ASIS. The direct anterior approach to the hip was performed through the Hueter interval.  Lateral femoral circumflex vessels were treated with the Auqumantys. The anterior capsule was exposed and an inverted T capsulotomy was made. The femoral neck cut was made to the level of the templated cut.  A corkscrew was placed into the head and the head was removed.  The femoral head was found to have eburnated bone.  The head was passed to the back table and was measured. Pubofemoral ligament was released off of the calcar, taking care to stay on bone. Superior capsule was released from the greater trochanter, taking care to stay lateral to the posterior border of the femoral neck in order to preserve the short external rotators.   Acetabular exposure was achieved, and the pulvinar and labrum were excised. Sequential reaming of the acetabulum was then performed up to a size 57 mm reamer. A 58 mm cup was then opened and impacted into place at approximately 40 degrees of abduction and 20 degrees of anteversion. The final polyethylene liner was impacted into place and acetabular osteophytes were removed.    I then gained femoral exposure taking care to protect the abductors and greater trochanter.  This was performed using standard external rotation, extension, and adduction.  A cookie cutter was used to enter the femoral canal, and then the femoral canal finder was placed.  Sequential broaching was performed up to a size 15.  Calcar planer was used on the femoral neck remnant.  I placed a high offset neck and a trial head ball.  The hip was reduced.  Leg lengths and offset were checked fluoroscopically.  The hip was dislocated and trial components were removed.  The final implants were placed, and the hip was reduced.  Fluoroscopy was used to confirm component position and leg lengths.  At 90 degrees of external rotation and full extension, the hip was stable to an anterior directed force.   The wound was copiously irrigated with Prontosan solution and normal saline using pule lavage.  Marcaine solution was injected into the periarticular soft tissue.  The wound was closed in layers using #1 Vicryl and V-Loc for the fascia, 2-0 Vicryl for the subcutaneous fat, 2-0 Monocryl for the deep dermal layer, 3-0 running Monocryl subcuticular stitch, and Dermabond for the skin.  Once the glue was fully dried, an Aquacell Ag dressing  was applied.  The patient was transported to the recovery room in stable condition.  Sponge, needle, and instrument counts were correct at the end of the case x2.  The patient tolerated the procedure well and there were no known complications.  Please note that a surgical assistant was a medical necessity for this procedure to perform it in a safe and expeditious manner. Assistant was necessary to provide appropriate retraction of vital neurovascular structures, to prevent femoral fracture, and to allow for anatomic placement of the prosthesis.

## 2022-05-25 NOTE — Evaluation (Signed)
Physical Therapy Evaluation Patient Details Name: Steven Kennedy MRN: [id number] DOB: 23-Jan-1951 Today's Date: 05/25/2022  History of Present Illness  Pt s/p L THR and with hx of R THR adn CAD s/p stent  Clinical Impression  Pt s/p L THR and presents with functional mobility limitations 2* decreased L LE strength/ROM and post op pain.  This date, pt performed HEP with written instruction provided and reviewed, ambulated in hall, negotiated stairs, reviewed LB dressing and reviewed car transfers.  Pt and spouse eager for dc this date.     Recommendations for follow up therapy are one component of a multi-disciplinary discharge planning process, led by the attending physician.  Recommendations may be updated based on patient status, additional functional criteria and insurance authorization.  Follow Up Recommendations Follow physician's recommendations for discharge plan and follow up therapies      Assistance Recommended at Discharge Intermittent Supervision/Assistance  Patient can return home with the following  A little help with walking and/or transfers;A little help with bathing/dressing/bathroom;Assistance with cooking/housework;Assist for transportation;Help with stairs or ramp for entrance    Equipment Recommendations None recommended by PT  Recommendations for Other Services       Functional Status Assessment Patient has had a recent decline in their functional status and demonstrates the ability to make significant improvements in function in a reasonable and predictable amount of time.     Precautions / Restrictions Precautions Precautions: Fall Restrictions Weight Bearing Restrictions: No Other Position/Activity Restrictions: WBAT      Mobility  Bed Mobility Overal bed mobility: Needs Assistance Bed Mobility: Supine to Sit     Supine to sit: Min assist     General bed mobility comments: cues for sequence and use of R LE to self assist    Transfers Overall  transfer level: Needs assistance Equipment used: Rolling walker (2 wheels) Transfers: Sit to/from Stand Sit to Stand: Min guard, Supervision           General transfer comment: cues for LE management and use of UEs to self assist    Ambulation/Gait Ambulation/Gait assistance: Min guard, Supervision Gait Distance (Feet): 150 Feet Assistive device: Rolling walker (2 wheels) Gait Pattern/deviations: Step-to pattern, Decreased step length - right, Decreased step length - left, Shuffle, Trunk flexed       General Gait Details: cues for sequence, posture and position from RW  Stairs Stairs: Yes Stairs assistance: Min assist Stair Management: One rail Right, Step to pattern, Forwards, With cane Number of Stairs: 3 General stair comments: cues for sequence and cane placement  Wheelchair Mobility    Modified Rankin (Stroke Patients Only)       Balance Overall balance assessment: Mild deficits observed, not formally tested                                           Pertinent Vitals/Pain Pain Assessment Pain Assessment: 0-10 Pain Score: 3  Pain Location: L hip Pain Descriptors / Indicators: Aching, Sore Pain Intervention(s): Limited activity within patient's tolerance, Monitored during session, Premedicated before session, Ice applied    Home Living Family/patient expects to be discharged to:: Private residence Living Arrangements: Spouse/significant other Available Help at Discharge: Family;Available 24 hours/day Type of Home: House Home Access: Stairs to enter Entrance Stairs-Rails: Right Entrance Stairs-Number of Steps: 2   Home Layout: One level Home Equipment: Conservation officer, nature (2 wheels);Cane - single point  Prior Function Prior Level of Function : Independent/Modified Independent             Mobility Comments: using cane prior to admit       Hand Dominance   Dominant Hand: Right    Extremity/Trunk Assessment   Upper  Extremity Assessment Upper Extremity Assessment: RUE deficits/detail RUE Deficits / Details: limited shoulder elevation AROM - pt states he has to help lift it with his other arm and may need a TSR in the future    Lower Extremity Assessment Lower Extremity Assessment: LLE deficits/detail LLE Deficits / Details: 2+/5 strength at hip with AAROM at hip to 85 flex and 15 abd    Cervical / Trunk Assessment Cervical / Trunk Assessment: Normal  Communication   Communication: No difficulties  Cognition Arousal/Alertness: Awake/alert Behavior During Therapy: WFL for tasks assessed/performed Overall Cognitive Status: Within Functional Limits for tasks assessed                                          General Comments      Exercises Total Joint Exercises Ankle Circles/Pumps: AROM, Both, 20 reps, Supine Quad Sets: AROM, Both, 10 reps, Supine Heel Slides: AAROM, Left, 20 reps, Supine Hip ABduction/ADduction: AAROM, Left, 15 reps, Supine Long Arc Quad: AROM, Left, 10 reps, Seated   Assessment/Plan    PT Assessment Patient needs continued PT services  PT Problem List Decreased range of motion;Decreased strength;Decreased activity tolerance;Decreased balance;Decreased mobility;Decreased knowledge of use of DME;Pain       PT Treatment Interventions DME instruction;Gait training;Stair training;Functional mobility training;Therapeutic exercise;Therapeutic activities;Balance training;Patient/family education    PT Goals (Current goals can be found in the Care Plan section)  Acute Rehab PT Goals Patient Stated Goal: Regain IND PT Goal Formulation: All assessment and education complete, DC therapy    Frequency 7X/week     Co-evaluation               AM-PAC PT "6 Clicks" Mobility  Outcome Measure Help needed turning from your back to your side while in a flat bed without using bedrails?: A Little Help needed moving from lying on your back to sitting on the side  of a flat bed without using bedrails?: A Little Help needed moving to and from a bed to a chair (including a wheelchair)?: A Little Help needed standing up from a chair using your arms (e.g., wheelchair or bedside chair)?: A Little Help needed to walk in hospital room?: A Little Help needed climbing 3-5 steps with a railing? : A Little 6 Click Score: 18    End of Session Equipment Utilized During Treatment: Gait belt Activity Tolerance: Patient tolerated treatment well Patient left: in chair;with call bell/phone within reach;with family/visitor present Nurse Communication: Mobility status PT Visit Diagnosis: Difficulty in walking, not elsewhere classified (R26.2)    Time: 1440-1530 PT Time Calculation (min) (ACUTE ONLY): 50 min   Charges:   PT Evaluation $PT Eval Low Complexity: 1 Low PT Treatments $Gait Training: 8-22 mins $Therapeutic Exercise: 8-22 mins        Debe Coder PT Acute Rehabilitation Services Pager [phone] Office [phone]   Giankarlo Leamer 05/25/2022, 3:53 PM

## 2022-05-25 NOTE — Interval H&P Note (Signed)
History and Physical Interval Note:  05/25/2022 7:19 AM  Steven Kennedy  has presented today for surgery, with the diagnosis of Left hip osteoarthritis.  The various methods of treatment have been discussed with the patient and family. After consideration of risks, benefits and other options for treatment, the patient has consented to  Procedure(s) with comments: [name] - 150 as a surgical intervention.  The patient's history has been reviewed, patient examined, no change in status, stable for surgery.  I have reviewed the patient's chart and labs.  Questions were answered to the patient's satisfaction.     Hilton Cork Yoko Mcgahee

## 2022-05-25 NOTE — Discharge Instructions (Signed)
? ?Dr. Shawneen Deetz ?Joint Replacement Specialist ?[facility] ?[address] ?[place], [place] [address] ?[phone] ? ? ?TOTAL HIP REPLACEMENT POSTOPERATIVE DIRECTIONS ? ? ? ?Hip Rehabilitation, Guidelines Following Surgery  ? ?WEIGHT BEARING ?Weight bearing as tolerated with assist device (walker, cane, etc) as directed, use it as long as suggested by your surgeon or therapist, typically at least 4-6 weeks. ? ?The results of a hip operation are greatly improved after range of motion and muscle strengthening exercises. Follow all safety measures which are given to protect your hip. If any of these exercises cause increased pain or swelling in your joint, decrease the amount until you are comfortable again. Then slowly increase the exercises. Call your caregiver if you have problems or questions.  ? ?HOME CARE INSTRUCTIONS  ?Most of the following instructions are designed to prevent the dislocation of your new hip.  ?Remove items at home which could result in a fall. This includes throw rugs or furniture in walking pathways.  ?Continue medications as instructed at time of discharge. ?You may have some home medications which will be placed on hold until you complete the course of blood thinner medication. ?You may start showering once you are discharged home. Do not remove your dressing. ?Do not put on socks or shoes without following the instructions of your caregivers.   ?Sit on chairs with arms. Use the chair arms to help push yourself up when arising.  ?Arrange for the use of a toilet seat elevator so you are not sitting low.  ?Walk with walker as instructed.  ?You may resume a sexual relationship in one month or when given the OK by your caregiver.  ?Use walker as long as suggested by your caregivers.  ?You may put full weight on your legs and walk as much as is comfortable. ?Avoid periods of inactivity such as sitting longer than an hour when not asleep. This helps prevent blood  clots.  ?You may return to work once you are cleared by your surgeon.  ?Do not drive a car for 6 weeks or until released by your surgeon.  ?Do not drive while taking narcotics.  ?Wear elastic stockings for two weeks following surgery during the day but you may remove then at night.  ?Make sure you keep all of your appointments after your operation with all of your doctors and caregivers. You should call the office at the above phone number and make an appointment for approximately two weeks after the date of your surgery. ?Please pick up a stool softener and laxative for home use as long as you are requiring pain medications. ?ICE to the affected hip every three hours for 30 minutes at a time and then as needed for pain and swelling. Continue to use ice on the hip for pain and swelling from surgery. You may notice swelling that will progress down to the foot and ankle.  This is normal after surgery.  Elevate the leg when you are not up walking on it.   ?It is important for you to complete the blood thinner medication as prescribed by your doctor. ?Continue to use the breathing machine which will help keep your temperature down.  It is common for your temperature to cycle up and down following surgery, especially at night when you are not up moving around and exerting yourself.  The breathing machine keeps your lungs expanded and your temperature down. ? ?RANGE OF MOTION AND STRENGTHENING EXERCISES  ?These exercises are designed to help you   keep full movement of your hip joint. Follow your caregiver's or physical therapist's instructions. Perform all exercises about fifteen times, three times per day or as directed. Exercise both hips, even if you have had only one joint replacement. These exercises can be done on a training (exercise) mat, on the floor, on a table or on a bed. Use whatever works the best and is most comfortable for you. Use music or television while you are exercising so that the exercises are a  pleasant break in your day. This will make your life better with the exercises acting as a break in routine you can look forward to.  ?Lying on your back, slowly slide your foot toward your buttocks, raising your knee up off the floor. Then slowly slide your foot back down until your leg is straight again.  ?Lying on your back spread your legs as far apart as you can without causing discomfort.  ?Lying on your side, raise your upper leg and foot straight up from the floor as far as is comfortable. Slowly lower the leg and repeat.  ?Lying on your back, tighten up the muscle in the front of your thigh (quadriceps muscles). You can do this by keeping your leg straight and trying to raise your heel off the floor. This helps strengthen the largest muscle supporting your knee.  ?Lying on your back, tighten up the muscles of your buttocks both with the legs straight and with the knee bent at a comfortable angle while keeping your heel on the floor.  ? ?SKILLED REHAB INSTRUCTIONS: ?If the patient is transferred to a skilled rehab facility following release from the hospital, a list of the current medications will be sent to the facility for the patient to continue.  When discharged from the skilled rehab facility, please have the facility set up the patient's Home Health Physical Therapy prior to being released. Also, the skilled facility will be responsible for providing the patient with their medications at time of release from the facility to include their pain medication and their blood thinner medication. If the patient is still at the rehab facility at time of the two week follow up appointment, the skilled rehab facility will also need to assist the patient in arranging follow up appointment in our office and any transportation needs. ? ?POST-OPERATIVE OPIOID TAPER INSTRUCTIONS: ?It is important to wean off of your opioid medication as soon as possible. If you do not need pain medication after your surgery it is ok  to stop day one. ?Opioids include: ?Codeine, Hydrocodone(Norco, Vicodin), Oxycodone(Percocet, oxycontin) and hydromorphone amongst others.  ?Long term and even short term use of opiods can cause: ?Increased pain response ?Dependence ?Constipation ?Depression ?Respiratory depression ?And more.  ?Withdrawal symptoms can include ?Flu like symptoms ?Nausea, vomiting ?And more ?Techniques to manage these symptoms ?Hydrate well ?Eat regular healthy meals ?Stay active ?Use relaxation techniques(deep breathing, meditating, yoga) ?Do Not substitute Alcohol to help with tapering ?If you have been on opioids for less than two weeks and do not have pain than it is ok to stop all together.  ?Plan to wean off of opioids ?This plan should start within one week post op of your joint replacement. ?Maintain the same interval or time between taking each dose and first decrease the dose.  ?Cut the total daily intake of opioids by one tablet each day ?Next start to increase the time between doses. ?The last dose that should be eliminated is the evening dose.  ? ? ?MAKE   SURE YOU:  ?Understand these instructions.  ?Will watch your condition.  ?Will get help right away if you are not doing well or get worse. ? ?Pick up stool softner and laxative for home use following surgery while on pain medications. ?Do not remove your dressing. ?The dressing is waterproof--it is OK to take showers. ?Continue to use ice for pain and swelling after surgery. ?Do not use any lotions or creams on the incision until instructed by your surgeon. ?Total Hip Protocol. ? ?

## 2022-05-25 NOTE — Transfer of Care (Signed)
Immediate Anesthesia Transfer of Care Note  Patient: Steven Kennedy  Procedure(s) Performed: TOTAL HIP ARTHROPLASTY ANTERIOR APPROACH (Left: Hip)  Patient Location: PACU  Anesthesia Type:Spinal  Level of Consciousness: sedated, patient cooperative, and responds to stimulation  Airway & Oxygen Therapy: Patient Spontanous Breathing and Patient connected to face mask oxygen  Post-op Assessment: Report given to RN and Post -op Vital signs reviewed and stable  Post vital signs: Reviewed and stable  Last Vitals:  Vitals Value Taken Time  BP 116/71 05/25/22 0930  Temp    Pulse 46 05/25/22 0932  Resp 8 05/25/22 0932  SpO2 98 % 05/25/22 0932  Vitals shown include unvalidated device data.  Last Pain:  Vitals:   05/25/22 0553  TempSrc: Oral  PainSc:       Patients Stated Pain Goal: 5 ([id number])  Complications: No notable events documented.

## 2022-05-25 NOTE — Anesthesia Procedure Notes (Signed)
Spinal  Start time: 05/25/2022 7:30 AM End time: 05/25/2022 7:35 AM Staffing Performed: resident/CRNA  Resident/CRNA: Gean Maidens, CRNA Performed by: Gean Maidens, CRNA Authorized by: Lidia Collum, MD   Preanesthetic Checklist Completed: patient identified, IV checked, site marked, risks and benefits discussed, surgical consent, monitors and equipment checked, pre-op evaluation and timeout performed Spinal Block Patient position: sitting Prep: DuraPrep Patient monitoring: heart rate, blood pressure and continuous pulse ox Approach: midline Location: L3-4 Injection technique: single-shot Needle Needle type: Pencan  Needle gauge: 24 G Needle length: 9 cm Needle insertion depth: 7 cm Assessment Events: CSF return Additional Notes Pt sitting position, sterile prep and drape, negative paresthesia/heme

## 2022-05-25 NOTE — Anesthesia Postprocedure Evaluation (Signed)
Anesthesia Post Note  Patient: DURAN OHERN  Procedure(s) Performed: TOTAL HIP ARTHROPLASTY ANTERIOR APPROACH (Left: Hip)     Patient location during evaluation: PACU Anesthesia Type: General Level of consciousness: oriented and awake and alert Pain management: pain level controlled Vital Signs Assessment: post-procedure vital signs reviewed and stable Respiratory status: spontaneous breathing, respiratory function stable and nonlabored ventilation Cardiovascular status: blood pressure returned to baseline and stable Postop Assessment: no headache, no backache, no apparent nausea or vomiting and spinal receding Anesthetic complications: no   No notable events documented.  Last Vitals:  Vitals:   05/25/22 1445 05/25/22 1545  BP: 136/86 (!) 155/94  Pulse: (!) 57 76  Resp:    Temp:    SpO2: 94% 97%    Last Pain:  Vitals:   05/25/22 1520  TempSrc:   PainSc: 2                  Lidia Collum

## 2022-05-26 ENCOUNTER — Encounter (HOSPITAL_COMMUNITY): Payer: Self-pay | Admitting: Orthopedic Surgery

## 2022-06-09 DIAGNOSIS — Z96642 Presence of left artificial hip joint: Secondary | ICD-10-CM | POA: Diagnosis not present

## 2022-06-09 DIAGNOSIS — Z471 Aftercare following joint replacement surgery: Secondary | ICD-10-CM | POA: Diagnosis not present

## 2022-06-30 ENCOUNTER — Encounter: Payer: Self-pay | Admitting: Gastroenterology

## 2022-07-09 ENCOUNTER — Other Ambulatory Visit: Payer: Self-pay | Admitting: Cardiology

## 2022-07-11 DIAGNOSIS — Z96642 Presence of left artificial hip joint: Secondary | ICD-10-CM | POA: Diagnosis not present

## 2022-07-11 DIAGNOSIS — Z471 Aftercare following joint replacement surgery: Secondary | ICD-10-CM | POA: Diagnosis not present

## 2022-08-08 ENCOUNTER — Other Ambulatory Visit: Payer: Self-pay | Admitting: Cardiology

## 2022-08-24 DIAGNOSIS — L219 Seborrheic dermatitis, unspecified: Secondary | ICD-10-CM | POA: Diagnosis not present

## 2022-08-24 DIAGNOSIS — C44519 Basal cell carcinoma of skin of other part of trunk: Secondary | ICD-10-CM | POA: Diagnosis not present

## 2022-08-24 DIAGNOSIS — C44529 Squamous cell carcinoma of skin of other part of trunk: Secondary | ICD-10-CM | POA: Diagnosis not present

## 2022-08-24 DIAGNOSIS — Z85820 Personal history of malignant melanoma of skin: Secondary | ICD-10-CM | POA: Diagnosis not present

## 2022-08-24 DIAGNOSIS — L570 Actinic keratosis: Secondary | ICD-10-CM | POA: Diagnosis not present

## 2022-09-05 DIAGNOSIS — C44529 Squamous cell carcinoma of skin of other part of trunk: Secondary | ICD-10-CM | POA: Diagnosis not present

## 2022-10-11 ENCOUNTER — Other Ambulatory Visit: Payer: Self-pay | Admitting: Cardiology

## 2022-10-17 ENCOUNTER — Other Ambulatory Visit: Payer: Self-pay | Admitting: Cardiology

## 2022-10-17 DIAGNOSIS — Z87442 Personal history of urinary calculi: Secondary | ICD-10-CM | POA: Insufficient documentation

## 2022-10-17 DIAGNOSIS — M1990 Unspecified osteoarthritis, unspecified site: Secondary | ICD-10-CM | POA: Insufficient documentation

## 2022-10-18 NOTE — Telephone Encounter (Signed)
Rx to pharmacy

## 2022-10-23 ENCOUNTER — Ambulatory Visit: Payer: PPO | Admitting: Cardiology

## 2022-11-13 ENCOUNTER — Other Ambulatory Visit: Payer: Self-pay | Admitting: Cardiology

## 2022-11-16 ENCOUNTER — Other Ambulatory Visit: Payer: Self-pay | Admitting: Cardiology

## 2022-11-16 NOTE — Telephone Encounter (Signed)
Refill to pharmacy 

## 2022-11-20 DIAGNOSIS — Z96642 Presence of left artificial hip joint: Secondary | ICD-10-CM | POA: Diagnosis not present

## 2022-11-23 ENCOUNTER — Other Ambulatory Visit: Payer: Self-pay | Admitting: Cardiology

## 2022-11-23 NOTE — Telephone Encounter (Signed)
Refill to pharmacy, patient has appointment 01/29/23

## 2023-01-03 ENCOUNTER — Other Ambulatory Visit: Payer: Self-pay | Admitting: Cardiology

## 2023-01-19 ENCOUNTER — Other Ambulatory Visit: Payer: Self-pay | Admitting: Cardiology

## 2023-01-29 ENCOUNTER — Ambulatory Visit: Payer: PPO | Admitting: Cardiology

## 2023-02-02 ENCOUNTER — Other Ambulatory Visit: Payer: Self-pay | Admitting: Cardiology

## 2023-02-05 NOTE — Progress Notes (Unsigned)
Cardiology Office Note:    Date:  02/06/2023   ID:  Steven Kennedy, DOB 11/16/1950, MRN [id number]  PCP:  Charlott Rakes, MD  Cardiologist:  Norman Herrlich, MD    Referring MD: Charlott Rakes, MD    ASSESSMENT:    1. Coronary artery disease involving native coronary artery of native heart without angina pectoris   2. Essential hypertension   3. Mixed hyperlipidemia   4. Sinus bradycardia    PLAN:    In order of problems listed above:  He continues to do well since his presentation with troponin normal ACS December 2020 no anginal discomfort after PCI and stent will continue long-term clopidogrel therapy along with lipid-lowering rosuvastatin. Well-controlled currently not on antihypertensive agent Continue his high intensity statin I will check a lipid profile and CMP today Stable no recurrent symptomatic bradycardia he has a smart watch and I strongly encouraged him to wear it to self monitor   Next appointment: 1 year   Medication Adjustments/Labs and Tests Ordered: Current medicines are reviewed at length with the patient today.  Concerns regarding medicines are outlined above.  Orders Placed This Encounter  Procedures   EKG 12-Lead   No orders of the defined types were placed in this encounter.    History of Present Illness:    Steven Kennedy is a 72 y.o. male with a hx of CAD with PCI drug-eluting stent right coronary artery December 2020 with an unusual clinical presentation of postexertional syncope hypertension sinus bradycardia and dyslipidemia last seen 09/21/2021.  Lipid profile 09/21/2021 LDL is 53 cholesterol 112.  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 abnormal nuc - cath 06/2019 s/p DES to dRCA with residual moderate disease in the proximal and mid RCA, proximal LCx and proximal RI. EF 55%.   CAD in native artery 06/13/2019   Dyslipidemia    Essential hypertension 06/13/2019   History of kidney stones    Hyperlipidemia 06/13/2019   Hypertension    Osteoarthritis of left hip 05/25/2022   Pre-op evaluation 06/04/2019   Rotator cuff tear arthropathy 11/24/2020   Sinus bradycardia    Skin cancer 2007   Syncope and collapse     Current Medications: Current Meds  Medication Sig   amLODipine (NORVASC) 5 MG tablet TAKE 1 TABLET BY MOUTH EVERY DAY   amoxicillin (AMOXIL) 500 MG tablet Take 2,000 mg by mouth See admin instructions. Take 2000 mg by mouth prior dental visits   clopidogrel (PLAVIX) 75 MG tablet TAKE 1 TABLET BY MOUTH EVERY DAY WITH BREAKFAST   CRANBERRY EXTRACT PO Take 25,000 mg by mouth daily.   fluticasone (FLONASE) 50 MCG/ACT nasal spray Place 1 spray into both nostrils daily as needed for allergies.   loratadine (CLARITIN) 10 MG tablet Take 10 mg by mouth daily as needed for allergies.   Magnesium Gluconate (MAGNESIUM 27 PO) Take 2 tablets by mouth daily.   Multiple Vitamin (MULTIVITAMIN) tablet Take 1 tablet by mouth daily.   Omega-3 Fatty Acids (FISH OIL PO) Take 1 capsule by mouth daily.   rosuvastatin (CRESTOR) 20 MG tablet TAKE 1 TABLET BY MOUTH EVERY  DAY   Turmeric 500 MG CAPS Take 3,000 mg by mouth daily.   vitamin C (ASCORBIC ACID) 500 MG tablet Take 1,500 mg by mouth daily.   zinc gluconate 50 MG tablet Take 100 mg by mouth daily.      EKGs/Labs/Other Studies Reviewed:    The following studies were reviewed today:  Cardiac Studies & Procedures   CARDIAC CATHETERIZATION  CARDIAC CATHETERIZATION 06/13/2019  Narrative  CULPRIT LESION: Dist RCA lesion is 85% stenosed.  A drug-eluting stent was successfully placed using a  STENT RESOLUTE ONYX 2.5X12.  Post intervention, there is a 0% residual stenosis.  -------------------------  Prox RCA lesion is 60% stenosed. Mid RCA lesion is 45% stenosed.  Prox Cx lesion is 65% stenosed.  Ramus-1 lesion is 50% stenosed. Ramus-2 lesion is 55% stenosed. Lat Ramus lesion is 50% stenosed.  Ost LAD to Prox LAD lesion is 20% stenosed.  Dist LAD lesion is 90% stenosed.  LV end diastolic pressure is normal.  There is no aortic valve stenosis.  SUMMARY  Severe single-vessel disease with focal 85% distal RCA just prior to a bifurcating PDA (correlates with ischemic distribution on stress test)-> successfully treated with Resolute Onyx DES 2.5 mm x 12 mm (tapered from 2.9 to 2.6 mm)  Residual moderate disease in the proximal and mid RCA, proximal LCx and proximal RI.  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  TR band removal per protocol  Anticipate same-day discharge and follow-up with Dr. Dulce Sellar  Clopidogrel 75 mg daily initiated.  No beta-blocker based on significant bradycardia in the Cath Lab (heart rates in the 40s)   Patient's wife was updated via telephone.   Bryan Lemma, MD  Findings Coronary Findings Diagnostic  Dominance: Co-dominant  Left Main Vessel is large.  Left Anterior Descending Vessel is large. There is mild diffuse disease throughout the vessel. Ost LAD to Prox LAD lesion is 20% stenosed. Followed by ectatic segment Dist LAD lesion is 90% stenosed. Near apical lesion  First Diagonal Branch Vessel is small in size.  First Septal Branch Vessel is small in size.  Second Septal Branch Vessel is small in size.  Ramus Intermedius Vessel is large. The vessel exhibits minimal luminal irregularities. Ramus-1 lesion is 50% stenosed. The lesion is eccentric. Has a stairstep feature Ramus-2 lesion is 55% stenosed. The lesion is focal. Stairstep feature  Lateral Ramus  Intermedius Vessel is small in size. Lat Ramus lesion is 50% stenosed. The lesion is focal.  Left Circumflex Prox Cx lesion is 65% stenosed. The lesion is located at the bend and eccentric.  First Obtuse Marginal Branch Vessel is small in size.  Right Coronary Artery Vessel is large. There is mild diffuse disease throughout the vessel. Prox RCA lesion is 60% stenosed. The lesion is eccentric and irregular. Mid RCA lesion is 45% stenosed. The lesion is focal and concentric. Dist RCA lesion is 85% stenosed.  Acute Marginal Branch Vessel is small in size.  Right Ventricular Branch Vessel is small in size.  Right Posterior Atrioventricular Artery Vessel is small in size.  Intervention  Dist RCA lesion Stent Lesion length:  8 mm. CATH VISTA GUIDE 6FR JR4 guide catheter was inserted. Lesion crossed with guidewire using a WIRE ASAHI PROWATER 180CM. Pre-stent angioplasty was performed using a BALLOON SAPPHIRE 2.0X12. Maximum pressure:  12 atm. Inflation time: 20 sec. A drug-eluting stent was successfully placed using a STENT RESOLUTE ONYX 2.5X12. Maximum pressure: 14 atm. Inflation time: 30  sec. Minimum lumen area:  2.8 mm. Stent strut is well apposed. Tapered from 2.9- 2.6 mm Post-stent angioplasty was performed using a BALLOON SAPPHIRE [place] 2.75X8. Maximum pressure:  16 atm. Inflation time:  20 sec. Post-Intervention Lesion Assessment The intervention was successful. Pre-interventional TIMI flow is 3. Post-intervention TIMI flow is 3. Treated lesion length:  12 mm. No complications occurred at this lesion. There is a 0% residual stenosis post intervention.   STRESS TESTS  MYOCARDIAL PERFUSION IMAGING 05/29/2019  Narrative  The left ventricular ejection fraction is normal (55-65%).  Nuclear stress EF: 55%.  Defect 1: There is a small defect of mild severity present in the basal inferior location. This defect is reversible with normal wall motion.  Findings consistent with  ischemia.  This is an intermediate risk study.              EKG Interpretation Date/Time:  Tuesday February 06 2023 08:53:44 EDT Ventricular Rate:  53 PR Interval:  200 QRS Duration:  94 QT Interval:  450 QTC Calculation: 422 R Axis:   35  Text Interpretation: Sinus bradycardia Normal EKG When compared with ECG of 13-Jun-2019 12:48, No significant change was found Confirmed by Norman Herrlich ([id number]) on 02/06/2023 9:11:14 AM    Recent Labs: 05/16/2022: BUN 10; Creatinine, Ser 0.87; Hemoglobin 15.5; Platelets 200; Potassium 4.6; Sodium 142  Recent Lipid Panel    Component Value Date/Time   CHOL 112 09/21/2021 1316   TRIG 62 09/21/2021 1316   HDL 45 09/21/2021 1316   CHOLHDL 2.5 09/21/2021 1316   LDLCALC 53 09/21/2021 1316    Physical Exam:    VS:  BP 126/78   Pulse (!) 53   Ht 6' 1.6" (1.869 m)   Wt 225 lb (102.1 kg)   SpO2 95%   BMI 29.20 kg/m     Wt Readings from Last 3 Encounters:  02/06/23 225 lb (102.1 kg)  05/25/22 222 lb 0.1 oz (100.7 kg)  05/16/22 222 lb (100.7 kg)     GEN:  Well nourished, well developed in no acute distress HEENT: Normal NECK: No JVD; No carotid bruits LYMPHATICS: No lymphadenopathy CARDIAC: RRR, no murmurs, rubs, gallops RESPIRATORY:  Clear to auscultation without rales, wheezing or rhonchi  ABDOMEN: Soft, non-tender, non-distended MUSCULOSKELETAL:  No edema; No deformity  SKIN: Warm and dry NEUROLOGIC:  Alert and oriented x 3 PSYCHIATRIC:  Normal affect    Signed, Norman Herrlich, MD  02/06/2023 9:25 AM    [facility]

## 2023-02-06 ENCOUNTER — Encounter: Payer: Self-pay | Admitting: Cardiology

## 2023-02-06 ENCOUNTER — Ambulatory Visit: Payer: PPO | Attending: Cardiology | Admitting: Cardiology

## 2023-02-06 VITALS — BP 126/78 | HR 53 | Ht 73.6 in | Wt 225.0 lb

## 2023-02-06 DIAGNOSIS — R001 Bradycardia, unspecified: Secondary | ICD-10-CM | POA: Diagnosis not present

## 2023-02-06 DIAGNOSIS — I2510 Atherosclerotic heart disease of native coronary artery without angina pectoris: Secondary | ICD-10-CM | POA: Diagnosis not present

## 2023-02-06 DIAGNOSIS — I10 Essential (primary) hypertension: Secondary | ICD-10-CM

## 2023-02-06 DIAGNOSIS — E782 Mixed hyperlipidemia: Secondary | ICD-10-CM | POA: Diagnosis not present

## 2023-02-06 MED ORDER — AMLODIPINE BESYLATE 5 MG PO TABS: 5.0000 mg | ORAL_TABLET | Freq: Every day | ORAL | 3 refills | Status: DC

## 2023-02-06 NOTE — Patient Instructions (Signed)
Medication Instructions:  Your physician recommends that you continue on your current medications as directed. Please refer to the Current Medication list given to you today.  *If you need a refill on your cardiac medications before your next appointment, please call your pharmacy*   Lab Work: Your physician recommends that you return for lab work in:   Labs today: CMP, Lipids  If you have labs (blood work) drawn today and your tests are completely normal, you will receive your results only by: MyChart Message (if you have MyChart) OR A paper copy in the mail If you have any lab test that is abnormal or we need to change your treatment, we will call you to review the results.   Testing/Procedures: None   Follow-Up: At [facility], you and your health needs are our priority.  As part of our continuing mission to provide you with exceptional heart care, we have created designated Provider Care Teams.  These Care Teams include your primary Cardiologist (physician) and Advanced Practice Providers (APPs -  Physician Assistants and Nurse Practitioners) who all work together to provide you with the care you need, when you need it.  We recommend signing up for the patient portal called "MyChart".  Sign up information is provided on this After Visit Summary.  MyChart is used to connect with patients for Virtual Visits (Telemedicine).  Patients are able to view lab/test results, encounter notes, upcoming appointments, etc.  Non-urgent messages can be sent to your provider as well.   To learn more about what you can do with MyChart, go to [email or web address].    Your next appointment:   1 year(s)  Provider:   Brian Munley, MD    Other Instructions None  

## 2023-02-06 NOTE — Addendum Note (Signed)
Addended by: Roxanne Mins I on: 02/06/2023 09:59 AM   Modules accepted: Orders

## 2023-02-26 DIAGNOSIS — L570 Actinic keratosis: Secondary | ICD-10-CM | POA: Diagnosis not present

## 2023-02-26 DIAGNOSIS — D485 Neoplasm of uncertain behavior of skin: Secondary | ICD-10-CM | POA: Diagnosis not present

## 2023-02-26 DIAGNOSIS — C44319 Basal cell carcinoma of skin of other parts of face: Secondary | ICD-10-CM | POA: Diagnosis not present

## 2023-02-26 DIAGNOSIS — L578 Other skin changes due to chronic exposure to nonionizing radiation: Secondary | ICD-10-CM | POA: Diagnosis not present

## 2023-02-26 DIAGNOSIS — L821 Other seborrheic keratosis: Secondary | ICD-10-CM | POA: Diagnosis not present

## 2023-05-19 ENCOUNTER — Other Ambulatory Visit: Payer: Self-pay | Admitting: Cardiology

## 2023-06-05 DIAGNOSIS — Z96642 Presence of left artificial hip joint: Secondary | ICD-10-CM | POA: Diagnosis not present

## 2023-08-06 ENCOUNTER — Other Ambulatory Visit: Payer: Self-pay | Admitting: Cardiology

## 2023-08-28 DIAGNOSIS — L578 Other skin changes due to chronic exposure to nonionizing radiation: Secondary | ICD-10-CM | POA: Diagnosis not present

## 2023-08-28 DIAGNOSIS — L570 Actinic keratosis: Secondary | ICD-10-CM | POA: Diagnosis not present

## 2023-08-28 DIAGNOSIS — L821 Other seborrheic keratosis: Secondary | ICD-10-CM | POA: Diagnosis not present

## 2023-09-24 ENCOUNTER — Other Ambulatory Visit (HOSPITAL_BASED_OUTPATIENT_CLINIC_OR_DEPARTMENT_OTHER): Payer: Self-pay | Admitting: Family Medicine

## 2023-09-24 DIAGNOSIS — Z1211 Encounter for screening for malignant neoplasm of colon: Secondary | ICD-10-CM | POA: Diagnosis not present

## 2023-09-24 DIAGNOSIS — R1909 Other intra-abdominal and pelvic swelling, mass and lump: Secondary | ICD-10-CM

## 2023-09-24 DIAGNOSIS — Z0000 Encounter for general adult medical examination without abnormal findings: Secondary | ICD-10-CM | POA: Diagnosis not present

## 2023-09-24 DIAGNOSIS — Z1331 Encounter for screening for depression: Secondary | ICD-10-CM | POA: Diagnosis not present

## 2023-09-24 DIAGNOSIS — Z6831 Body mass index (BMI) 31.0-31.9, adult: Secondary | ICD-10-CM | POA: Diagnosis not present

## 2023-09-24 DIAGNOSIS — Z125 Encounter for screening for malignant neoplasm of prostate: Secondary | ICD-10-CM | POA: Diagnosis not present

## 2023-09-24 DIAGNOSIS — Z131 Encounter for screening for diabetes mellitus: Secondary | ICD-10-CM | POA: Diagnosis not present

## 2023-09-24 DIAGNOSIS — E785 Hyperlipidemia, unspecified: Secondary | ICD-10-CM | POA: Diagnosis not present

## 2023-09-25 ENCOUNTER — Ambulatory Visit (INDEPENDENT_AMBULATORY_CARE_PROVIDER_SITE_OTHER)
Admission: RE | Admit: 2023-09-25 | Discharge: 2023-09-25 | Disposition: A | Discharge status: Home / Self Care | Source: Ambulatory Visit | Attending: Family Medicine | Admitting: Family Medicine

## 2023-09-25 DIAGNOSIS — R1909 Other intra-abdominal and pelvic swelling, mass and lump: Secondary | ICD-10-CM

## 2023-09-25 DIAGNOSIS — R599 Enlarged lymph nodes, unspecified: Secondary | ICD-10-CM | POA: Diagnosis not present

## 2023-10-01 DIAGNOSIS — Z6831 Body mass index (BMI) 31.0-31.9, adult: Secondary | ICD-10-CM | POA: Diagnosis not present

## 2023-10-01 DIAGNOSIS — R1909 Other intra-abdominal and pelvic swelling, mass and lump: Secondary | ICD-10-CM | POA: Diagnosis not present

## 2023-10-02 ENCOUNTER — Other Ambulatory Visit (HOSPITAL_COMMUNITY): Payer: Self-pay | Admitting: Family Medicine

## 2023-10-02 DIAGNOSIS — R1909 Other intra-abdominal and pelvic swelling, mass and lump: Secondary | ICD-10-CM

## 2023-10-02 NOTE — Progress Notes (Signed)
 Roanna Banning, MD  Claudean Kinds PROCEDURE / BIOPSY REVIEW Date: 10/02/23  Requested Biopsy site: L groin Reason for request: mass Imaging review: Best seen on [place] LLE  Decision: Approved Imaging modality to perform: Ultrasound Schedule with: No sedation / Local anesthetic Schedule for: Any VIR  Additional comments: @VIR : L groin mass most c/w pathologic LN. Send in NS @Schedulers . [place] L groin mass Bx. Local  Please contact me with questions, concerns, or if issue pertaining to this request arise.  Roanna Banning, MD [facility]       Previous Messages    ----- Message ----- From: Claudean Kinds Sent: 10/02/2023  10:21 AM EDT To: Claudean Kinds; Ir Procedure Requests Subject: [place] Core Biopsy ( Soft tissue)                  Procedure : [place] core biopsy ( Soft tissue )  Reason : Left Groin mass Dx: Groin mass [R19.09 (ICD-10-CM)]    History : [place] LT LOWER EXTREM LTD SOFT TISSUE NON VASCULAR  Provider : Charlott Rakes, MD  Provider contact :  [phone]

## 2023-10-15 DIAGNOSIS — Z6831 Body mass index (BMI) 31.0-31.9, adult: Secondary | ICD-10-CM | POA: Diagnosis not present

## 2023-10-15 DIAGNOSIS — M7989 Other specified soft tissue disorders: Secondary | ICD-10-CM | POA: Diagnosis not present

## 2023-10-25 ENCOUNTER — Ambulatory Visit (HOSPITAL_COMMUNITY)
Admission: RE | Admit: 2023-10-25 | Discharge: 2023-10-25 | Disposition: A | Discharge status: Home / Self Care | Source: Ambulatory Visit | Attending: Family Medicine | Admitting: Family Medicine

## 2023-10-25 DIAGNOSIS — R1909 Other intra-abdominal and pelvic swelling, mass and lump: Secondary | ICD-10-CM | POA: Diagnosis not present

## 2023-10-25 DIAGNOSIS — R590 Localized enlarged lymph nodes: Secondary | ICD-10-CM | POA: Insufficient documentation

## 2023-10-25 DIAGNOSIS — C774 Secondary and unspecified malignant neoplasm of inguinal and lower limb lymph nodes: Secondary | ICD-10-CM | POA: Insufficient documentation

## 2023-10-25 DIAGNOSIS — C801 Malignant (primary) neoplasm, unspecified: Secondary | ICD-10-CM | POA: Diagnosis not present

## 2023-10-25 DIAGNOSIS — C779 Secondary and unspecified malignant neoplasm of lymph node, unspecified: Secondary | ICD-10-CM | POA: Diagnosis not present

## 2023-10-25 DIAGNOSIS — R599 Enlarged lymph nodes, unspecified: Secondary | ICD-10-CM | POA: Diagnosis not present

## 2023-10-25 MED ORDER — LIDOCAINE HCL (PF) 1 % IJ SOLN
10.0000 mL | Freq: Once | INTRAMUSCULAR | Status: AC
  Administered 2023-10-25: 10 mL via INTRADERMAL

## 2023-10-25 NOTE — Procedures (Signed)
Interventional Radiology Procedure Note  Procedure: US Guided Biopsy of left inguinal lymph node  Complications: None  Estimated Blood Loss: < 10 mL  Findings: 16 G core biopsy of left inguinal lymph node performed under US guidance.  Five core samples obtained and sent to Pathology.  Donis Pinder T. Stokely Jeancharles, M.D Pager:  [phone]   

## 2023-10-29 LAB — SURGICAL PATHOLOGY

## 2023-10-31 DIAGNOSIS — C4359 Malignant melanoma of other part of trunk: Secondary | ICD-10-CM | POA: Diagnosis not present

## 2023-10-31 DIAGNOSIS — Z6831 Body mass index (BMI) 31.0-31.9, adult: Secondary | ICD-10-CM | POA: Diagnosis not present

## 2023-11-14 DIAGNOSIS — C779 Secondary and unspecified malignant neoplasm of lymph node, unspecified: Secondary | ICD-10-CM | POA: Insufficient documentation

## 2023-11-16 ENCOUNTER — Other Ambulatory Visit: Payer: Self-pay | Admitting: Cardiology

## 2023-11-16 DIAGNOSIS — C7989 Secondary malignant neoplasm of other specified sites: Secondary | ICD-10-CM | POA: Diagnosis not present

## 2023-11-16 DIAGNOSIS — C779 Secondary and unspecified malignant neoplasm of lymph node, unspecified: Secondary | ICD-10-CM | POA: Diagnosis not present

## 2023-11-16 DIAGNOSIS — C4359 Malignant melanoma of other part of trunk: Secondary | ICD-10-CM | POA: Diagnosis not present

## 2023-11-16 DIAGNOSIS — C439 Malignant melanoma of skin, unspecified: Secondary | ICD-10-CM | POA: Diagnosis not present

## 2023-11-27 DIAGNOSIS — R93422 Abnormal radiologic findings on diagnostic imaging of left kidney: Secondary | ICD-10-CM | POA: Diagnosis not present

## 2023-11-27 DIAGNOSIS — C774 Secondary and unspecified malignant neoplasm of inguinal and lower limb lymph nodes: Secondary | ICD-10-CM | POA: Diagnosis not present

## 2023-11-27 DIAGNOSIS — R9389 Abnormal findings on diagnostic imaging of other specified body structures: Secondary | ICD-10-CM | POA: Diagnosis not present

## 2023-11-27 DIAGNOSIS — E041 Nontoxic single thyroid nodule: Secondary | ICD-10-CM | POA: Diagnosis not present

## 2023-11-27 DIAGNOSIS — C7989 Secondary malignant neoplasm of other specified sites: Secondary | ICD-10-CM | POA: Diagnosis not present

## 2023-11-27 DIAGNOSIS — C439 Malignant melanoma of skin, unspecified: Secondary | ICD-10-CM | POA: Diagnosis not present

## 2023-11-27 DIAGNOSIS — C779 Secondary and unspecified malignant neoplasm of lymph node, unspecified: Secondary | ICD-10-CM | POA: Diagnosis not present

## 2023-11-27 DIAGNOSIS — R600 Localized edema: Secondary | ICD-10-CM | POA: Diagnosis not present

## 2023-11-27 DIAGNOSIS — R937 Abnormal findings on diagnostic imaging of other parts of musculoskeletal system: Secondary | ICD-10-CM | POA: Diagnosis not present

## 2023-11-29 DIAGNOSIS — Z79899 Other long term (current) drug therapy: Secondary | ICD-10-CM | POA: Insufficient documentation

## 2023-11-30 DIAGNOSIS — C775 Secondary and unspecified malignant neoplasm of intrapelvic lymph nodes: Secondary | ICD-10-CM | POA: Diagnosis not present

## 2023-11-30 DIAGNOSIS — C779 Secondary and unspecified malignant neoplasm of lymph node, unspecified: Secondary | ICD-10-CM | POA: Diagnosis not present

## 2023-11-30 DIAGNOSIS — Z7902 Long term (current) use of antithrombotics/antiplatelets: Secondary | ICD-10-CM | POA: Diagnosis not present

## 2023-11-30 DIAGNOSIS — L97529 Non-pressure chronic ulcer of other part of left foot with unspecified severity: Secondary | ICD-10-CM | POA: Diagnosis not present

## 2023-11-30 DIAGNOSIS — Z79899 Other long term (current) drug therapy: Secondary | ICD-10-CM | POA: Diagnosis not present

## 2023-11-30 DIAGNOSIS — Z7982 Long term (current) use of aspirin: Secondary | ICD-10-CM | POA: Diagnosis not present

## 2023-11-30 DIAGNOSIS — I10 Essential (primary) hypertension: Secondary | ICD-10-CM | POA: Diagnosis not present

## 2023-12-13 DIAGNOSIS — C779 Secondary and unspecified malignant neoplasm of lymph node, unspecified: Secondary | ICD-10-CM | POA: Diagnosis not present

## 2023-12-21 DIAGNOSIS — I10 Essential (primary) hypertension: Secondary | ICD-10-CM | POA: Diagnosis not present

## 2023-12-21 DIAGNOSIS — Z79899 Other long term (current) drug therapy: Secondary | ICD-10-CM | POA: Diagnosis not present

## 2023-12-21 DIAGNOSIS — Z7902 Long term (current) use of antithrombotics/antiplatelets: Secondary | ICD-10-CM | POA: Diagnosis not present

## 2023-12-21 DIAGNOSIS — C779 Secondary and unspecified malignant neoplasm of lymph node, unspecified: Secondary | ICD-10-CM | POA: Diagnosis not present

## 2023-12-21 DIAGNOSIS — Z5112 Encounter for antineoplastic immunotherapy: Secondary | ICD-10-CM | POA: Diagnosis not present

## 2023-12-21 DIAGNOSIS — Z87891 Personal history of nicotine dependence: Secondary | ICD-10-CM | POA: Diagnosis not present

## 2023-12-21 DIAGNOSIS — Z7982 Long term (current) use of aspirin: Secondary | ICD-10-CM | POA: Diagnosis not present

## 2023-12-21 DIAGNOSIS — I2510 Atherosclerotic heart disease of native coronary artery without angina pectoris: Secondary | ICD-10-CM | POA: Diagnosis not present

## 2023-12-21 DIAGNOSIS — R569 Unspecified convulsions: Secondary | ICD-10-CM | POA: Diagnosis not present

## 2023-12-21 DIAGNOSIS — C7652 Malignant neoplasm of left lower limb: Secondary | ICD-10-CM | POA: Diagnosis not present

## 2023-12-25 DIAGNOSIS — C439 Malignant melanoma of skin, unspecified: Secondary | ICD-10-CM | POA: Diagnosis not present

## 2023-12-25 DIAGNOSIS — C779 Secondary and unspecified malignant neoplasm of lymph node, unspecified: Secondary | ICD-10-CM | POA: Diagnosis not present

## 2024-01-12 ENCOUNTER — Other Ambulatory Visit: Payer: Self-pay | Admitting: Cardiology

## 2024-01-18 DIAGNOSIS — Z96643 Presence of artificial hip joint, bilateral: Secondary | ICD-10-CM | POA: Diagnosis not present

## 2024-01-18 DIAGNOSIS — Z955 Presence of coronary angioplasty implant and graft: Secondary | ICD-10-CM | POA: Diagnosis not present

## 2024-01-18 DIAGNOSIS — I2510 Atherosclerotic heart disease of native coronary artery without angina pectoris: Secondary | ICD-10-CM | POA: Diagnosis not present

## 2024-01-18 DIAGNOSIS — C778 Secondary and unspecified malignant neoplasm of lymph nodes of multiple regions: Secondary | ICD-10-CM | POA: Diagnosis not present

## 2024-01-18 DIAGNOSIS — I10 Essential (primary) hypertension: Secondary | ICD-10-CM | POA: Diagnosis not present

## 2024-01-18 DIAGNOSIS — C4372 Malignant melanoma of left lower limb, including hip: Secondary | ICD-10-CM | POA: Diagnosis not present

## 2024-01-18 DIAGNOSIS — Z7902 Long term (current) use of antithrombotics/antiplatelets: Secondary | ICD-10-CM | POA: Diagnosis not present

## 2024-01-18 DIAGNOSIS — E0590 Thyrotoxicosis, unspecified without thyrotoxic crisis or storm: Secondary | ICD-10-CM | POA: Diagnosis not present

## 2024-01-18 DIAGNOSIS — Z7982 Long term (current) use of aspirin: Secondary | ICD-10-CM | POA: Diagnosis not present

## 2024-01-18 DIAGNOSIS — Z79899 Other long term (current) drug therapy: Secondary | ICD-10-CM | POA: Diagnosis not present

## 2024-01-18 DIAGNOSIS — Z8249 Family history of ischemic heart disease and other diseases of the circulatory system: Secondary | ICD-10-CM | POA: Diagnosis not present

## 2024-01-18 DIAGNOSIS — C779 Secondary and unspecified malignant neoplasm of lymph node, unspecified: Secondary | ICD-10-CM | POA: Diagnosis not present

## 2024-01-18 DIAGNOSIS — Z87891 Personal history of nicotine dependence: Secondary | ICD-10-CM | POA: Diagnosis not present

## 2024-01-18 DIAGNOSIS — Z803 Family history of malignant neoplasm of breast: Secondary | ICD-10-CM | POA: Diagnosis not present

## 2024-01-18 DIAGNOSIS — R569 Unspecified convulsions: Secondary | ICD-10-CM | POA: Diagnosis not present

## 2024-01-18 DIAGNOSIS — C439 Malignant melanoma of skin, unspecified: Secondary | ICD-10-CM | POA: Diagnosis not present

## 2024-01-29 DIAGNOSIS — E0590 Thyrotoxicosis, unspecified without thyrotoxic crisis or storm: Secondary | ICD-10-CM | POA: Diagnosis not present

## 2024-01-30 ENCOUNTER — Other Ambulatory Visit: Payer: Self-pay | Admitting: Cardiology

## 2024-02-08 DIAGNOSIS — I10 Essential (primary) hypertension: Secondary | ICD-10-CM | POA: Diagnosis not present

## 2024-02-08 DIAGNOSIS — Z7982 Long term (current) use of aspirin: Secondary | ICD-10-CM | POA: Diagnosis not present

## 2024-02-08 DIAGNOSIS — L97929 Non-pressure chronic ulcer of unspecified part of left lower leg with unspecified severity: Secondary | ICD-10-CM | POA: Diagnosis not present

## 2024-02-08 DIAGNOSIS — Z7902 Long term (current) use of antithrombotics/antiplatelets: Secondary | ICD-10-CM | POA: Diagnosis not present

## 2024-02-08 DIAGNOSIS — Z79899 Other long term (current) drug therapy: Secondary | ICD-10-CM | POA: Diagnosis not present

## 2024-02-08 DIAGNOSIS — C779 Secondary and unspecified malignant neoplasm of lymph node, unspecified: Secondary | ICD-10-CM | POA: Diagnosis not present

## 2024-02-29 DIAGNOSIS — I10 Essential (primary) hypertension: Secondary | ICD-10-CM | POA: Diagnosis not present

## 2024-02-29 DIAGNOSIS — Z79899 Other long term (current) drug therapy: Secondary | ICD-10-CM | POA: Diagnosis not present

## 2024-02-29 DIAGNOSIS — C779 Secondary and unspecified malignant neoplasm of lymph node, unspecified: Secondary | ICD-10-CM | POA: Diagnosis not present

## 2024-02-29 DIAGNOSIS — C792 Secondary malignant neoplasm of skin: Secondary | ICD-10-CM | POA: Diagnosis not present

## 2024-02-29 DIAGNOSIS — C7902 Secondary malignant neoplasm of left kidney and renal pelvis: Secondary | ICD-10-CM | POA: Diagnosis not present

## 2024-02-29 DIAGNOSIS — Z87891 Personal history of nicotine dependence: Secondary | ICD-10-CM | POA: Diagnosis not present

## 2024-02-29 DIAGNOSIS — G40909 Epilepsy, unspecified, not intractable, without status epilepticus: Secondary | ICD-10-CM | POA: Diagnosis not present

## 2024-02-29 DIAGNOSIS — Z7902 Long term (current) use of antithrombotics/antiplatelets: Secondary | ICD-10-CM | POA: Diagnosis not present

## 2024-02-29 DIAGNOSIS — Z7982 Long term (current) use of aspirin: Secondary | ICD-10-CM | POA: Diagnosis not present

## 2024-02-29 DIAGNOSIS — E039 Hypothyroidism, unspecified: Secondary | ICD-10-CM | POA: Diagnosis not present

## 2024-03-10 ENCOUNTER — Other Ambulatory Visit: Payer: Self-pay | Admitting: Cardiology

## 2024-03-11 NOTE — Progress Notes (Unsigned)
 Cardiology Office Note:    Date:  03/11/2024   ID:  Steven Kennedy, DOB [birth year], MRN [id number]  PCP:  Trinidad Glisson, MD  Cardiologist:  Redell Leiter, MD    Referring MD: Trinidad Glisson, MD    ASSESSMENT:    1. Coronary artery disease involving native coronary artery of native heart without angina pectoris   2. Mixed hyperlipidemia   3. Essential hypertension   4. Sinus bradycardia    PLAN:    In order of problems listed above:  From cardiology perspective Nathyn continues to do well following PCI and stent and on current medical treatment Especially during his cancer treatment continue his cardiac medications for cardioprotective effect I alerted his wife that if there is ever mention of heart trouble he needs a heart ultrasound as the relative frequency of melena involving the heart is relatively high Lipids are well treated lipid profile 09/24/2023 LDL 62 cholesterol 112 non-HDL cholesterol 50 Stable sinus bradycardia we have avoided rate slowing calcium  channel blockers and beta-blockers   Next appointment: I will plan to see him in 1 year   Medication Adjustments/Labs and Tests Ordered: Current medicines are reviewed at length with the patient today.  Concerns regarding medicines are outlined above.  No orders of the defined types were placed in this encounter.  No orders of the defined types were placed in this encounter.    History of Present Illness:    Steven Kennedy is a 73 y.o. male with a hx of CAD with PCI and drug-eluting stent right coronary artery December 2020 with an unusual clinical presentation of postexertional syncope hypertension sinus bradycardia and dyslipidemia last seen 02/06/2023.  Compliance with diet, lifestyle and medications: Yes  [name] is seen along with his wife he struggled recently with his metastatic melanoma care at [facility] immunotherapy. Fortunately there is no cardiac complication he has not had angina no shortness  of breath edema palpitation or syncope He EKG is stable today including ST segment QT interval Noted change in his cardiac medicines and continues with clopidogrel  long-term rosuvastatin  and a calcium  channel blocker. He did develop thyroid  dysfunction now takes thyroid  hormone. Past Medical History:  Diagnosis Date   Abnormal nuclear stress test 06/13/2019   Arthritis    CAD (coronary artery disease)    a. abnormal nuc - cath 06/2019 s/p DES to dRCA with residual moderate disease in the proximal and mid RCA, proximal LCx and proximal RI. EF 55%.   CAD in native artery 06/13/2019   Dyslipidemia    Essential hypertension 06/13/2019   History of kidney stones    Hyperlipidemia 06/13/2019   Hypertension    Osteoarthritis of left hip 05/25/2022   Pre-op evaluation 06/04/2019   Rotator cuff tear arthropathy 11/24/2020   Sinus bradycardia    Skin cancer 2007   Syncope and collapse     Current Medications: No outpatient medications have been marked as taking for the 03/13/24 encounter (Appointment) with Leiter Redell PARAS, MD.      EKGs/Labs/Other Studies Reviewed:    The following studies were reviewed today:  Cardiac Studies & Procedures   ______________________________________________________________________________________________ CARDIAC CATHETERIZATION  CARDIAC CATHETERIZATION 06/13/2019  Conclusion  CULPRIT LESION: Dist RCA lesion is 85% stenosed.  A drug-eluting stent was successfully placed using a STENT RESOLUTE ONYX 2.5X12.  Post intervention, there is a 0% residual stenosis.  -------------------------  Prox RCA lesion is 60% stenosed. Mid RCA lesion is 45% stenosed.  Prox Cx lesion is 65% stenosed.  Ramus-1 lesion is 50% stenosed. Ramus-2 lesion is 55% stenosed. Lat Ramus lesion is 50% stenosed.  Ost LAD to Prox LAD lesion is 20% stenosed.  Dist LAD lesion is 90% stenosed.  LV end diastolic pressure is normal.  There is no aortic valve  stenosis.  SUMMARY  Severe single-vessel disease with focal 85% distal RCA just prior to a bifurcating PDA (correlates with ischemic distribution on stress test)-> successfully treated with Resolute Onyx DES 2.5 mm x 12 mm (tapered from 2.9 to 2.6 mm)  Residual moderate disease in the proximal and mid RCA, proximal LCx and proximal RI.  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  TR band removal per protocol  Anticipate same-day discharge and follow-up with Dr. Monetta  Clopidogrel  75 mg daily initiated.  No beta-blocker based on significant bradycardia in the Cath Lab (heart rates in the 40s)   Patient's wife was updated via telephone.   Alm Clay, MD  Findings Coronary Findings Diagnostic  Dominance: Co-dominant  Left Main Vessel is large.  Left Anterior Descending Vessel is large. There is mild diffuse disease throughout the vessel. Ost LAD to Prox LAD lesion is 20% stenosed. Followed by ectatic segment Dist LAD lesion is 90% stenosed. Near apical lesion  First Diagonal Branch Vessel is small in size.  First Septal Branch Vessel is small in size.  Second Septal Branch Vessel is small in size.  Ramus Intermedius Vessel is large. The vessel exhibits minimal luminal irregularities. Ramus-1 lesion is 50% stenosed. The lesion is eccentric. Has a stairstep feature Ramus-2 lesion is 55% stenosed. The lesion is focal. Stairstep feature  Lateral Ramus Intermedius Vessel is small in size. Lat Ramus lesion is 50% stenosed. The lesion is focal.  Left Circumflex Prox Cx lesion is 65% stenosed. The lesion is located at the bend and eccentric.  First Obtuse Marginal Branch Vessel is small in size.  Right Coronary Artery Vessel is large. There is mild diffuse disease throughout the vessel. Prox RCA lesion is 60% stenosed. The lesion is eccentric and irregular. Mid RCA lesion is 45% stenosed. The lesion is focal  and concentric. Dist RCA lesion is 85% stenosed.  Acute Marginal Branch Vessel is small in size.  Right Ventricular Branch Vessel is small in size.  Right Posterior Atrioventricular Artery Vessel is small in size.  Intervention  Dist RCA lesion Stent Lesion length:  8 mm. CATH VISTA GUIDE 6FR JR4 guide catheter was inserted. Lesion crossed with guidewire using a WIRE ASAHI PROWATER 180CM. Pre-stent angioplasty was performed using a BALLOON SAPPHIRE 2.0X12. Maximum pressure:  12 atm. Inflation time: 20 sec. A drug-eluting stent was successfully placed using a STENT RESOLUTE ONYX 2.5X12. Maximum pressure: 14 atm. Inflation time: 30 sec. Minimum lumen area:  2.8 mm. Stent strut is well apposed. Tapered from 2.9- 2.6 mm Post-stent angioplasty was performed using a BALLOON SAPPHIRE [place] 2.75X8. Maximum pressure:  16 atm. Inflation time:  20 sec. Post-Intervention Lesion Assessment The intervention was successful. Pre-interventional TIMI flow is 3. Post-intervention TIMI flow is 3. Treated lesion length:  12 mm. No complications occurred at this lesion. There is a 0% residual stenosis post intervention.   STRESS TESTS  MYOCARDIAL PERFUSION IMAGING 05/29/2019  Interpretation Summary  The left ventricular ejection fraction is normal (55-65%).  Nuclear stress EF: 55%.  Defect 1: There is a small defect of mild severity present in the basal inferior location. This defect is reversible with normal wall motion.  Findings  consistent with ischemia.  This is an intermediate risk study.            ______________________________________________________________________________________________        EKG Interpretation Date/Time:  Thursday March 13 2024 14:28:14 EDT Ventricular Rate:  63 PR Interval:  230 QRS Duration:  94 QT Interval:  424 QTC Calculation: 433 R Axis:   55  Text Interpretation: Sinus rhythm with 1st degree A-V block ST & T wave abnormality, consider  anterolateral ischemia When compared with ECG of 06-Feb-2023 08:53, Unchanged Confirmed by Monetta Rogue ([id number]) on 03/13/2024 2:38:29 PM    Recent Labs: No results found for requested labs within last 365 days.  Recent Lipid Panel    Component Value Date/Time   CHOL 108 02/06/2023 0959   TRIG 55 02/06/2023 0959   HDL 47 02/06/2023 0959   CHOLHDL 2.3 02/06/2023 0959   LDLCALC 48 02/06/2023 0959    Physical Exam:    VS:  There were no vitals taken for this visit.    Wt Readings from Last 3 Encounters:  02/06/23 225 lb (102.1 kg)  05/25/22 222 lb 0.1 oz (100.7 kg)  05/16/22 222 lb (100.7 kg)     GEN:  Well nourished, well developed in no acute distress HEENT: Normal NECK: No JVD; No carotid bruits LYMPHATICS: No lymphadenopathy CARDIAC: RRR, no murmurs, rubs, gallops RESPIRATORY:  Clear to auscultation without rales, wheezing or rhonchi  ABDOMEN: Soft, non-tender, non-distended MUSCULOSKELETAL:  No edema; No deformity  SKIN: Warm and dry NEUROLOGIC:  Alert and oriented x 3 PSYCHIATRIC:  Normal affect    Signed, Rogue Monetta, MD  03/11/2024 8:13 PM    [facility]

## 2024-03-12 ENCOUNTER — Other Ambulatory Visit: Payer: Self-pay

## 2024-03-13 ENCOUNTER — Encounter (HOSPITAL_BASED_OUTPATIENT_CLINIC_OR_DEPARTMENT_OTHER): Payer: Self-pay | Admitting: Student

## 2024-03-13 ENCOUNTER — Encounter: Payer: Self-pay | Admitting: Cardiology

## 2024-03-13 ENCOUNTER — Ambulatory Visit: Attending: Cardiology | Admitting: Cardiology

## 2024-03-13 ENCOUNTER — Ambulatory Visit (INDEPENDENT_AMBULATORY_CARE_PROVIDER_SITE_OTHER): Admitting: Student

## 2024-03-13 VITALS — BP 132/85 | HR 54 | Temp 97.9°F | Resp 16 | Ht 71.46 in | Wt 216.6 lb

## 2024-03-13 VITALS — BP 112/70 | HR 63 | Ht 71.0 in | Wt 219.6 lb

## 2024-03-13 DIAGNOSIS — C779 Secondary and unspecified malignant neoplasm of lymph node, unspecified: Secondary | ICD-10-CM | POA: Diagnosis not present

## 2024-03-13 DIAGNOSIS — Z7689 Persons encountering health services in other specified circumstances: Secondary | ICD-10-CM

## 2024-03-13 DIAGNOSIS — R001 Bradycardia, unspecified: Secondary | ICD-10-CM

## 2024-03-13 DIAGNOSIS — E039 Hypothyroidism, unspecified: Secondary | ICD-10-CM | POA: Diagnosis not present

## 2024-03-13 DIAGNOSIS — I2510 Atherosclerotic heart disease of native coronary artery without angina pectoris: Secondary | ICD-10-CM

## 2024-03-13 DIAGNOSIS — E782 Mixed hyperlipidemia: Secondary | ICD-10-CM

## 2024-03-13 DIAGNOSIS — I10 Essential (primary) hypertension: Secondary | ICD-10-CM | POA: Diagnosis not present

## 2024-03-13 NOTE — Patient Instructions (Signed)
 It was nice to see you today!  If you have any problems before your next visit feel free to message me via MyChart (minor issues or questions) or call the office, otherwise you may reach out to schedule an office visit.  Thank you! Pau Banh, PA-C

## 2024-03-13 NOTE — Progress Notes (Signed)
 New Patient Office Visit  Subjective    Patient ID: Steven Kennedy, male    DOB: 1950-12-11  Age: 73 y.o. MRN: [id number]  CC:  Chief Complaint  Patient presents with   Establish Care    Here to establish care.   Joint pain    Joint aching. Does not sleep good. Doing immunotherapy.    Discussed the use of AI scribe software for clinical note transcription with the patient, who gave verbal consent to proceed.  History of Present Illness   Steven Kennedy is a 73 year old male with melanoma who presents for blood work due to side effects from immunotherapy.  He has been experiencing severe joint pain and throbbing in his hands, particularly at night, as side effects from immunotherapy for melanoma. These symptoms began after his most recent treatment, following three prior treatments where he only experienced mild fatigue. He was diagnosed with melanoma in February 2025 after a biopsy of a groin mass, which has reduced in size from a grapefruit to a golf ball since starting immunotherapy.  He has had recent symptoms and lab monitoring for thyroid  function (T4 and TSH) after starting immunotherapy. He is currently undergoing blood work to monitor T4 and TSH levels.  He has a history of high cholesterol, managed with medication for the past 8-10 years. No history of diabetes and he has not smoked for 47 years.  He underwent a colonoscopy more than five years ago, which was normal, and he is up to date with his screenings. He has had two hip replacements, which may contribute to his difficulty in lifting his feet, leading to a recent fall where he hit his head, though he did not sustain significant injury.  He has been receiving oncology care at [facility]. He has had a PET scan showing resolution of a kidney spot and reduction in lymph node size after his second treatment.      Screenings:  Colon Cancer: utd Lung Cancer: no smoking Diabetes: no hx HLD: controlled    Outpatient Encounter Medications as of 03/13/2024  Medication Sig   amLODipine  (NORVASC ) 5 MG tablet TAKE 1 TABLET (5 MG TOTAL) BY MOUTH DAILY.   clopidogrel  (PLAVIX ) 75 MG tablet Take 1 tablet (75 mg total) by mouth daily. PLEASE KEEP UPCOMING APPOINTMENT IN ORDER TO RECEIVE ADDITIONAL REFILLS. THANK YOU!   CRANBERRY EXTRACT PO Take 25,000 mg by mouth daily.   fluticasone  (FLONASE ) 50 MCG/ACT nasal spray Place 1 spray into both nostrils daily as needed for allergies.   levothyroxine (SYNTHROID) 88 MCG tablet Take 88 mcg by mouth daily before breakfast.   loratadine  (CLARITIN ) 10 MG tablet Take 10 mg by mouth daily as needed for allergies.   Magnesium Gluconate (MAGNESIUM 27 PO) Take 2 tablets by mouth daily.   Multiple Vitamin (MULTIVITAMIN) tablet Take 1 tablet by mouth daily.   Omega-3 Fatty Acids (FISH OIL PO) Take 1 capsule by mouth daily.   rosuvastatin  (CRESTOR ) 20 MG tablet Take 1 tablet (20 mg total) by mouth daily.   Turmeric 500 MG CAPS Take 1,500 mg by mouth daily.   vitamin C (ASCORBIC ACID) 500 MG tablet Take 1,500 mg by mouth daily.   zinc gluconate 50 MG tablet Take 100 mg by mouth daily.   [DISCONTINUED] amoxicillin  (AMOXIL ) 500 MG tablet Take 2,000 mg by mouth See admin instructions. Take 2000 mg by mouth prior dental visits   No facility-administered encounter medications on file as of 03/13/2024.  Past Medical History:  Diagnosis Date   Abnormal nuclear stress test 06/13/2019   Arthritis    CAD (coronary artery disease)    a. abnormal nuc - cath 06/2019 s/p DES to dRCA with residual moderate disease in the proximal and mid RCA, proximal LCx and proximal RI. EF 55%.   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   Sinus bradycardia    Skin cancer 2007   Syncope and collapse     Past Surgical History:  Procedure Laterality Date   CORONARY STENT INTERVENTION N/A 06/13/2019   Procedure: CORONARY STENT INTERVENTION;  Surgeon: Anner Alm ORN, MD;  Location: [facility] INVASIVE CV LAB;  Service: Cardiovascular;  Laterality: N/A;   LEFT HEART CATH AND CORONARY ANGIOGRAPHY N/A 06/13/2019   Procedure: LEFT HEART CATH AND CORONARY ANGIOGRAPHY;  Surgeon: Anner Alm ORN, MD;  Location: [facility] INVASIVE CV LAB;  Service: Cardiovascular;  Laterality: N/A;   ROTATOR CUFF REPAIR Right    SKIN CANCER EXCISION     TOTAL HIP ARTHROPLASTY Right    TOTAL HIP ARTHROPLASTY Left 05/25/2022   Procedure: TOTAL HIP ARTHROPLASTY ANTERIOR APPROACH;  Surgeon: Fidel Rogue, MD;  Location: [facility];  Service: Orthopedics;  Laterality: Left;  150    Family History  Problem Relation Age of Onset   Heart attack Mother    Hyperlipidemia Mother    Hypertension Mother    Heart attack Father    Stroke Father    Hypertension Father    Hyperlipidemia Father    Hypertension Sister    Hyperlipidemia Sister    Dementia Paternal Grandmother    Heart attack Paternal Grandfather    Hypertension Sister    Hyperlipidemia Sister     Social History   Socioeconomic History   Marital status: Married    Spouse name: Not on file   Number of children: 2   Years of education: Not on file   Highest education level: Not on file  Occupational History   Not on file  Tobacco Use   Smoking status: Former    Current packs/day: 0.00    Average packs/day: 0.5 packs/day for 25.0 years (12.5 ttl pk-yrs)    Types: Cigarettes    Start date: 33    Quit date: 71    Years since quitting: 43.7    Passive exposure: Past   Smokeless tobacco: Former    Types: Chew    Quit date: 1982  Vaping Use   Vaping status: Never Used  Substance and Sexual Activity   Alcohol  use: Not  Currently    Comment: in years, not much   Drug use: Not Currently   Sexual activity: Not on file  Other Topics Concern   Not on file  Social History Narrative   Not on file   Social Drivers of Health   Financial Resource Strain: Not on file  Food Insecurity: No Food Insecurity (03/13/2024)   Hunger Vital Sign    Worried About Running Out of Food in the Last Year: Never true    Ran Out of Food in the Last Year: Never true  Transportation Needs: No Transportation Needs (03/13/2024)   PRAPARE - Transportation  Lack of Transportation (Medical): No    Lack of Transportation (Non-Medical): No  Physical Activity: Not on file  Stress: Not on file  Social Connections: Not on file  Intimate Partner Violence: Patient Unable To Answer (03/13/2024)   Humiliation, Afraid, Rape, and Kick questionnaire    Fear of Current or Ex-Partner: Patient unable to answer    Emotionally Abused: Patient unable to answer    Physically Abused: Patient unable to answer    Sexually Abused: Patient unable to answer    ROS  Per HPI      Objective    BP (!) 149/85   Pulse (!) 54   Temp 97.9 F (36.6 C) (Oral)   Resp 16   Ht 5' 11.46 (1.815 m)   Wt 216 lb 9.6 oz (98.2 kg)   SpO2 95%   BMI 29.82 kg/m   Physical Exam Constitutional:      General: He is not in acute distress.    Appearance: Normal appearance. He is not ill-appearing.  HENT:     Head: Normocephalic and atraumatic.     Right Ear: External ear normal.     Left Ear: External ear normal.     Nose: Nose normal.     Mouth/Throat:     Mouth: Mucous membranes are moist.     Pharynx: Oropharynx is clear.  Eyes:     General: No scleral icterus.    Extraocular Movements: Extraocular movements intact.     Conjunctiva/sclera: Conjunctivae normal.     Pupils: Pupils are equal, round, and reactive to light.  Neck:     Vascular: No carotid bruit.  Cardiovascular:     Rate and Rhythm: Normal rate and regular rhythm.     Pulses: Normal  pulses.     Heart sounds: Normal heart sounds. No murmur heard.    No friction rub.  Pulmonary:     Effort: Pulmonary effort is normal. No respiratory distress.     Breath sounds: Normal breath sounds. No wheezing, rhonchi or rales.  Musculoskeletal:        General: Normal range of motion.     Cervical back: Neck supple.     Right lower leg: No edema.     Left lower leg: No edema.  Skin:    General: Skin is warm and dry.     Coloration: Skin is not jaundiced or pale.  Neurological:     General: No focal deficit present.     Mental Status: He is alert.  Psychiatric:        Mood and Affect: Mood normal.        Behavior: Behavior normal.    Last CBC Lab Results  Component Value Date   WBC 5.5 05/16/2022   HGB 15.5 05/16/2022   HCT 45.6 05/16/2022   MCV 105.3 (H) 05/16/2022   MCH 35.8 (H) 05/16/2022   RDW 12.5 05/16/2022   PLT 200 05/16/2022   Last metabolic panel Lab Results  Component Value Date   GLUCOSE 93 02/06/2023   NA 141 02/06/2023   K 4.5 02/06/2023   CL 105 02/06/2023   CO2 23 02/06/2023   BUN 13 02/06/2023   CREATININE 1.18 02/06/2023   EGFR 66 02/06/2023   CALCIUM  9.4 02/06/2023   PROT 7.3 02/06/2023   ALBUMIN 4.8 02/06/2023   LABGLOB 2.5 02/06/2023   AGRATIO 2.3 (H) 09/21/2021   BILITOT 0.7 02/06/2023   ALKPHOS 70 02/06/2023   AST 25 02/06/2023   ALT 24 02/06/2023  ANIONGAP 9 05/16/2022   Last lipids Lab Results  Component Value Date   CHOL 108 02/06/2023   HDL 47 02/06/2023   LDLCALC 48 02/06/2023   TRIG 55 02/06/2023   CHOLHDL 2.3 02/06/2023   Last hemoglobin A1c No results found for: HGBA1C      Assessment & Plan:   Assessment and Plan    Establishment of Care  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 Currently undergoing immunotherapy at [facility]. Recent PET scan shows resolution of kidney lesion  and reduction in lymph node size. Experiencing side effects from immunotherapy, including joint pain and fatigue. Labs were requested by [facility] between his oncologic visits. 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 Developed hypothyroidism following initiation of immunotherapy. TSH and T4 levels need to be monitored to assess thyroid  function. - Order TSH and T4 tests  Hypercholesterolemia Chronic, stable. Long-standing hypercholesterolemia, well-controlled with medication for the past 8-10 years. - Continue current regimen.      Return in about 3 months (around 06/12/2024).   Rael Tilly T Ifeoma Vallin, PA-C

## 2024-03-13 NOTE — Patient Instructions (Signed)

## 2024-03-14 ENCOUNTER — Ambulatory Visit (HOSPITAL_BASED_OUTPATIENT_CLINIC_OR_DEPARTMENT_OTHER): Payer: Self-pay | Admitting: Student

## 2024-03-14 LAB — CORTISOL: Cortisol: 0.9 ug/dL — ABNORMAL LOW (ref 6.2–19.4)

## 2024-03-14 LAB — ACTH: ACTH: 8 pg/mL (ref 7.2–63.3)

## 2024-03-14 LAB — SEDIMENTATION RATE: Sed Rate: 12 mm/h (ref 0–30)

## 2024-03-14 LAB — TSH+FREE T4
Free T4: 0.59 ng/dL — ABNORMAL LOW (ref 0.82–1.77)
TSH: 0.08 u[IU]/mL — ABNORMAL LOW (ref 0.450–4.500)

## 2024-03-14 LAB — C-REACTIVE PROTEIN: CRP: 21 mg/L — ABNORMAL HIGH (ref 0–10)

## 2024-03-14 NOTE — Progress Notes (Signed)
 Called patient and discussed, faxing labs over urgently to [facility]. Still awaiting ACTH , will fax once received.

## 2024-03-15 DIAGNOSIS — Z7982 Long term (current) use of aspirin: Secondary | ICD-10-CM | POA: Diagnosis not present

## 2024-03-15 DIAGNOSIS — Z66 Do not resuscitate: Secondary | ICD-10-CM | POA: Diagnosis not present

## 2024-03-15 DIAGNOSIS — R001 Bradycardia, unspecified: Secondary | ICD-10-CM | POA: Diagnosis not present

## 2024-03-15 DIAGNOSIS — Z96643 Presence of artificial hip joint, bilateral: Secondary | ICD-10-CM | POA: Diagnosis not present

## 2024-03-15 DIAGNOSIS — C779 Secondary and unspecified malignant neoplasm of lymph node, unspecified: Secondary | ICD-10-CM | POA: Diagnosis not present

## 2024-03-15 DIAGNOSIS — Z79899 Other long term (current) drug therapy: Secondary | ICD-10-CM | POA: Diagnosis not present

## 2024-03-15 DIAGNOSIS — G5601 Carpal tunnel syndrome, right upper limb: Secondary | ICD-10-CM | POA: Diagnosis not present

## 2024-03-15 DIAGNOSIS — R930 Abnormal findings on diagnostic imaging of skull and head, not elsewhere classified: Secondary | ICD-10-CM | POA: Diagnosis not present

## 2024-03-15 DIAGNOSIS — D8481 Immunodeficiency due to conditions classified elsewhere: Secondary | ICD-10-CM | POA: Diagnosis not present

## 2024-03-15 DIAGNOSIS — E2740 Unspecified adrenocortical insufficiency: Secondary | ICD-10-CM | POA: Diagnosis not present

## 2024-03-15 DIAGNOSIS — Z955 Presence of coronary angioplasty implant and graft: Secondary | ICD-10-CM | POA: Diagnosis not present

## 2024-03-15 DIAGNOSIS — I10 Essential (primary) hypertension: Secondary | ICD-10-CM | POA: Diagnosis not present

## 2024-03-15 DIAGNOSIS — T451X5A Adverse effect of antineoplastic and immunosuppressive drugs, initial encounter: Secondary | ICD-10-CM | POA: Diagnosis not present

## 2024-03-15 DIAGNOSIS — M25449 Effusion, unspecified hand: Secondary | ICD-10-CM | POA: Diagnosis not present

## 2024-03-15 DIAGNOSIS — E236 Other disorders of pituitary gland: Secondary | ICD-10-CM | POA: Diagnosis not present

## 2024-03-15 DIAGNOSIS — Z85820 Personal history of malignant melanoma of skin: Secondary | ICD-10-CM | POA: Diagnosis not present

## 2024-03-15 DIAGNOSIS — I2510 Atherosclerotic heart disease of native coronary artery without angina pectoris: Secondary | ICD-10-CM | POA: Diagnosis not present

## 2024-03-15 DIAGNOSIS — Z87891 Personal history of nicotine dependence: Secondary | ICD-10-CM | POA: Diagnosis not present

## 2024-03-15 DIAGNOSIS — E038 Other specified hypothyroidism: Secondary | ICD-10-CM | POA: Diagnosis not present

## 2024-03-15 DIAGNOSIS — Z7902 Long term (current) use of antithrombotics/antiplatelets: Secondary | ICD-10-CM | POA: Diagnosis not present

## 2024-03-15 DIAGNOSIS — C439 Malignant melanoma of skin, unspecified: Secondary | ICD-10-CM | POA: Diagnosis not present

## 2024-03-15 DIAGNOSIS — E2749 Other adrenocortical insufficiency: Secondary | ICD-10-CM | POA: Diagnosis not present

## 2024-03-15 DIAGNOSIS — Z79890 Hormone replacement therapy (postmenopausal): Secondary | ICD-10-CM | POA: Diagnosis not present

## 2024-03-15 DIAGNOSIS — M2550 Pain in unspecified joint: Secondary | ICD-10-CM | POA: Diagnosis not present

## 2024-03-15 DIAGNOSIS — C792 Secondary malignant neoplasm of skin: Secondary | ICD-10-CM | POA: Diagnosis not present

## 2024-03-15 DIAGNOSIS — M25541 Pain in joints of right hand: Secondary | ICD-10-CM | POA: Diagnosis not present

## 2024-03-15 DIAGNOSIS — M2540 Effusion, unspecified joint: Secondary | ICD-10-CM | POA: Diagnosis not present

## 2024-03-15 DIAGNOSIS — E032 Hypothyroidism due to medicaments and other exogenous substances: Secondary | ICD-10-CM | POA: Diagnosis not present

## 2024-03-18 ENCOUNTER — Telehealth (HOSPITAL_BASED_OUTPATIENT_CLINIC_OR_DEPARTMENT_OTHER): Payer: Self-pay

## 2024-03-18 NOTE — Telephone Encounter (Signed)
 [facility] and they received records for labs were faxed earlier today. FYI to PCP

## 2024-03-25 DIAGNOSIS — Z23 Encounter for immunization: Secondary | ICD-10-CM | POA: Diagnosis not present

## 2024-03-28 DIAGNOSIS — C779 Secondary and unspecified malignant neoplasm of lymph node, unspecified: Secondary | ICD-10-CM | POA: Diagnosis not present

## 2024-03-28 DIAGNOSIS — M2550 Pain in unspecified joint: Secondary | ICD-10-CM | POA: Diagnosis not present

## 2024-03-28 DIAGNOSIS — Z79899 Other long term (current) drug therapy: Secondary | ICD-10-CM | POA: Diagnosis not present

## 2024-04-07 ENCOUNTER — Encounter (HOSPITAL_BASED_OUTPATIENT_CLINIC_OR_DEPARTMENT_OTHER): Payer: Self-pay

## 2024-04-07 ENCOUNTER — Encounter (HOSPITAL_BASED_OUTPATIENT_CLINIC_OR_DEPARTMENT_OTHER): Admitting: Student

## 2024-04-07 ENCOUNTER — Encounter (HOSPITAL_BASED_OUTPATIENT_CLINIC_OR_DEPARTMENT_OTHER): Payer: Self-pay | Admitting: Student

## 2024-04-07 DIAGNOSIS — C779 Secondary and unspecified malignant neoplasm of lymph node, unspecified: Secondary | ICD-10-CM | POA: Diagnosis not present

## 2024-04-07 NOTE — Patient Instructions (Addendum)
 It was nice to see you today!  As we discussed in clinic:  [facility] in: [place] Address: [address] [place], [place], [place] [address]  [facility] Address: [address] Phone: [phone]  [facility] Address: [address] [place], [place] [address] Phone: [phone]  [facility] Address: [address], [place], [place] [address] Phone: [phone]  If you have any problems before your next visit feel free to message me via MyChart (minor issues or questions) or call the office, otherwise you may reach out to schedule an office visit.  Thank you! Tovia Kisner, PA-C

## 2024-04-09 ENCOUNTER — Other Ambulatory Visit: Payer: Self-pay | Admitting: Cardiology

## 2024-04-09 DIAGNOSIS — E236 Other disorders of pituitary gland: Secondary | ICD-10-CM | POA: Diagnosis not present

## 2024-04-10 NOTE — Progress Notes (Signed)
 error

## 2024-04-14 DIAGNOSIS — E236 Other disorders of pituitary gland: Secondary | ICD-10-CM | POA: Diagnosis not present

## 2024-04-25 DIAGNOSIS — I2510 Atherosclerotic heart disease of native coronary artery without angina pectoris: Secondary | ICD-10-CM | POA: Diagnosis not present

## 2024-04-25 DIAGNOSIS — C792 Secondary malignant neoplasm of skin: Secondary | ICD-10-CM | POA: Diagnosis not present

## 2024-04-25 DIAGNOSIS — C779 Secondary and unspecified malignant neoplasm of lymph node, unspecified: Secondary | ICD-10-CM | POA: Diagnosis not present

## 2024-04-25 DIAGNOSIS — I10 Essential (primary) hypertension: Secondary | ICD-10-CM | POA: Diagnosis not present

## 2024-04-25 DIAGNOSIS — Z7982 Long term (current) use of aspirin: Secondary | ICD-10-CM | POA: Diagnosis not present

## 2024-04-25 DIAGNOSIS — Z87891 Personal history of nicotine dependence: Secondary | ICD-10-CM | POA: Diagnosis not present

## 2024-04-25 DIAGNOSIS — Z79899 Other long term (current) drug therapy: Secondary | ICD-10-CM | POA: Diagnosis not present

## 2024-04-28 ENCOUNTER — Other Ambulatory Visit: Payer: Self-pay | Admitting: Cardiology

## 2024-04-28 DIAGNOSIS — L821 Other seborrheic keratosis: Secondary | ICD-10-CM | POA: Diagnosis not present

## 2024-04-28 DIAGNOSIS — L578 Other skin changes due to chronic exposure to nonionizing radiation: Secondary | ICD-10-CM | POA: Diagnosis not present

## 2024-04-28 DIAGNOSIS — C44719 Basal cell carcinoma of skin of left lower limb, including hip: Secondary | ICD-10-CM | POA: Diagnosis not present

## 2024-04-28 DIAGNOSIS — C44712 Basal cell carcinoma of skin of right lower limb, including hip: Secondary | ICD-10-CM | POA: Diagnosis not present

## 2024-04-28 DIAGNOSIS — Z85820 Personal history of malignant melanoma of skin: Secondary | ICD-10-CM | POA: Diagnosis not present

## 2024-05-10 ENCOUNTER — Other Ambulatory Visit: Payer: Self-pay | Admitting: Cardiology

## 2024-05-26 DIAGNOSIS — M25541 Pain in joints of right hand: Secondary | ICD-10-CM | POA: Diagnosis not present

## 2024-05-26 DIAGNOSIS — I2510 Atherosclerotic heart disease of native coronary artery without angina pectoris: Secondary | ICD-10-CM | POA: Diagnosis not present

## 2024-05-26 DIAGNOSIS — M1990 Unspecified osteoarthritis, unspecified site: Secondary | ICD-10-CM | POA: Diagnosis not present

## 2024-05-26 DIAGNOSIS — Z85820 Personal history of malignant melanoma of skin: Secondary | ICD-10-CM | POA: Diagnosis not present

## 2024-05-26 DIAGNOSIS — G5601 Carpal tunnel syndrome, right upper limb: Secondary | ICD-10-CM | POA: Diagnosis not present

## 2024-05-30 DIAGNOSIS — C779 Secondary and unspecified malignant neoplasm of lymph node, unspecified: Secondary | ICD-10-CM | POA: Diagnosis not present

## 2024-05-30 DIAGNOSIS — Z79899 Other long term (current) drug therapy: Secondary | ICD-10-CM | POA: Diagnosis not present

## 2024-06-02 DIAGNOSIS — Z6831 Body mass index (BMI) 31.0-31.9, adult: Secondary | ICD-10-CM | POA: Diagnosis not present

## 2024-06-02 DIAGNOSIS — L97919 Non-pressure chronic ulcer of unspecified part of right lower leg with unspecified severity: Secondary | ICD-10-CM | POA: Diagnosis not present

## 2024-06-02 DIAGNOSIS — Z23 Encounter for immunization: Secondary | ICD-10-CM | POA: Diagnosis not present

## 2024-06-03 DIAGNOSIS — L814 Other melanin hyperpigmentation: Secondary | ICD-10-CM | POA: Diagnosis not present

## 2024-06-03 DIAGNOSIS — D489 Neoplasm of uncertain behavior, unspecified: Secondary | ICD-10-CM | POA: Diagnosis not present

## 2024-06-03 DIAGNOSIS — D229 Melanocytic nevi, unspecified: Secondary | ICD-10-CM | POA: Diagnosis not present

## 2024-06-03 DIAGNOSIS — Z85828 Personal history of other malignant neoplasm of skin: Secondary | ICD-10-CM | POA: Diagnosis not present

## 2024-06-03 DIAGNOSIS — L570 Actinic keratosis: Secondary | ICD-10-CM | POA: Diagnosis not present

## 2024-06-03 DIAGNOSIS — Z85820 Personal history of malignant melanoma of skin: Secondary | ICD-10-CM | POA: Diagnosis not present

## 2024-06-03 DIAGNOSIS — C779 Secondary and unspecified malignant neoplasm of lymph node, unspecified: Secondary | ICD-10-CM | POA: Diagnosis not present

## 2024-06-03 DIAGNOSIS — L821 Other seborrheic keratosis: Secondary | ICD-10-CM | POA: Diagnosis not present

## 2024-06-03 DIAGNOSIS — S81802A Unspecified open wound, left lower leg, initial encounter: Secondary | ICD-10-CM | POA: Diagnosis not present

## 2024-06-03 DIAGNOSIS — S81801A Unspecified open wound, right lower leg, initial encounter: Secondary | ICD-10-CM | POA: Diagnosis not present

## 2024-06-03 DIAGNOSIS — C44619 Basal cell carcinoma of skin of left upper limb, including shoulder: Secondary | ICD-10-CM | POA: Diagnosis not present

## 2024-06-14 DIAGNOSIS — I723 Aneurysm of iliac artery: Secondary | ICD-10-CM | POA: Diagnosis not present

## 2024-06-14 DIAGNOSIS — C772 Secondary and unspecified malignant neoplasm of intra-abdominal lymph nodes: Secondary | ICD-10-CM | POA: Diagnosis not present

## 2024-06-14 DIAGNOSIS — C779 Secondary and unspecified malignant neoplasm of lymph node, unspecified: Secondary | ICD-10-CM | POA: Diagnosis not present

## 2024-06-20 DIAGNOSIS — Z79899 Other long term (current) drug therapy: Secondary | ICD-10-CM | POA: Diagnosis not present

## 2024-06-20 DIAGNOSIS — C792 Secondary malignant neoplasm of skin: Secondary | ICD-10-CM | POA: Diagnosis not present

## 2024-06-20 DIAGNOSIS — C779 Secondary and unspecified malignant neoplasm of lymph node, unspecified: Secondary | ICD-10-CM | POA: Diagnosis not present
